# Patient Record
Sex: Female | Born: 2000 | Race: White | Hispanic: No | Marital: Single
Health system: Southern US, Community
[De-identification: ages and names within clinical notes are randomized; demographics above are authoritative.]

---
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
## 2022-01-20 ENCOUNTER — Emergency Department (HOSPITAL_COMMUNITY)
Admission: EM | Admit: 2022-01-20 | Discharge: 2022-01-20 | Disposition: A | Discharge status: Home / Self Care | Payer: 59 | Attending: Emergency Medicine | Admitting: Emergency Medicine

## 2022-01-20 ENCOUNTER — Other Ambulatory Visit: Payer: Self-pay

## 2022-01-20 DIAGNOSIS — K219 Gastro-esophageal reflux disease without esophagitis: Secondary | ICD-10-CM | POA: Insufficient documentation

## 2022-01-20 DIAGNOSIS — R059 Cough, unspecified: Secondary | ICD-10-CM | POA: Diagnosis present

## 2022-01-20 MED ORDER — PANTOPRAZOLE SODIUM 20 MG PO TBEC: 20.0000 mg | DELAYED_RELEASE_TABLET | Freq: Two times a day (BID) | ORAL | 0 refills | Status: DC

## 2022-01-20 MED ORDER — SUCRALFATE 1 G PO TABS
1.0000 g | ORAL_TABLET | Freq: Once | ORAL | Status: AC
  Administered 2022-01-20: 1 g via ORAL
  Filled 2022-01-20: qty 1

## 2022-01-20 MED ORDER — LIDOCAINE VISCOUS HCL 2 % MT SOLN
15.0000 mL | Freq: Once | OROMUCOSAL | Status: AC
  Administered 2022-01-20: 15 mL via ORAL
  Filled 2022-01-20: qty 15

## 2022-01-20 MED ORDER — ALUM & MAG HYDROXIDE-SIMETH 200-200-20 MG/5ML PO SUSP
30.0000 mL | Freq: Once | ORAL | Status: AC
  Administered 2022-01-20: 30 mL via ORAL
  Filled 2022-01-20: qty 30

## 2022-01-20 MED ORDER — SUCRALFATE 1 G PO TABS: 1.0000 g | ORAL_TABLET | Freq: Four times a day (QID) | ORAL | 0 refills | Status: DC

## 2022-01-20 NOTE — ED Triage Notes (Signed)
Patient reports she feels like something is stuck in throat/chest, coughing and "spitting up" for a couple of months. Patient says she feels burning in central chest region and back.

## 2022-01-20 NOTE — ED Provider Notes (Signed)
[place] DEPT Provider Note   CSN: [id number] Arrival date & time: 01/20/22  8786     History  Chief Complaint  Patient presents with   Cough    Rebecca Hoover is a 21 y.o. female.  21 year old female presents with several month history of difficulty swallowing.  Patient states that is exacerbated by certain foods.  Denies any emesis.  States that it can make her feel anxious at times with chest discomfort.  Denies any dyspnea.  No fever or chills.  No hemoptysis.      Home Medications Prior to Admission medications   Not on File      Allergies    Patient has no known allergies.    Review of Systems   Review of Systems  All other systems reviewed and are negative.  Physical Exam Updated Vital Signs BP 112/84    Pulse 68    Temp 97.6 F (36.4 C) (Oral)    Resp 13    Ht 1.702 m (5\' 7" )    Wt 56.2 kg    LMP 12/30/2021    SpO2 100%    BMI 19.42 kg/m  Physical Exam Vitals and nursing note reviewed.  Constitutional:      General: She is not in acute distress.    Appearance: Normal appearance. She is well-developed. She is not toxic-appearing.  HENT:     Head: Normocephalic and atraumatic.  Eyes:     General: Lids are normal.     Conjunctiva/sclera: Conjunctivae normal.     Pupils: Pupils are equal, round, and reactive to light.  Neck:     Thyroid: No thyroid mass.     Trachea: No tracheal deviation.  Cardiovascular:     Rate and Rhythm: Normal rate and regular rhythm.     Heart sounds: Normal heart sounds. No murmur heard.   No gallop.  Pulmonary:     Effort: Pulmonary effort is normal. No respiratory distress.     Breath sounds: Normal breath sounds. No stridor. No decreased breath sounds, wheezing, rhonchi or rales.  Abdominal:     General: There is no distension.     Palpations: Abdomen is soft.     Tenderness: There is no abdominal tenderness. There is no rebound.  Musculoskeletal:        General: No tenderness. Normal  range of motion.     Cervical back: Normal range of motion and neck supple.  Skin:    General: Skin is warm and dry.     Findings: No abrasion or rash.  Neurological:     Mental Status: She is alert and oriented to person, place, and time. Mental status is at baseline.     GCS: GCS eye subscore is 4. GCS verbal subscore is 5. GCS motor subscore is 6.     Cranial Nerves: No cranial nerve deficit.     Sensory: No sensory deficit.     Motor: Motor function is intact.  Psychiatric:        Attention and Perception: Attention normal.        Speech: Speech normal.        Behavior: Behavior normal.    ED Results / Procedures / Treatments   Labs (all labs ordered are listed, but only abnormal results are displayed) Labs Reviewed - No data to display  EKG None  Radiology No results found.  Procedures Procedures    Medications Ordered in ED Medications  alum & mag hydroxide-simeth (MAALOX/MYLANTA) 200-200-20 MG/5ML suspension  30 mL (has no administration in time range)    And  lidocaine (XYLOCAINE) 2 % viscous mouth solution 15 mL (has no administration in time range)  sucralfate (CARAFATE) tablet 1 g (has no administration in time range)    ED Course/ Medical Decision Making/ A&P                           Medical Decision Making Risk OTC drugs. Prescription drug management.   Old records reviewed and care done with mother in room.  Patient symptoms likely consistent with reflux.  Treated for same here.  Will place on PPI and Carafate.  Have instructed her to follow-up with a GI doctor for shortness        Final Clinical Impression(s) / ED Diagnoses Final diagnoses:  None    Rx / DC Orders ED Discharge Orders     None         Lacretia Leigh, MD 01/20/22 2008

## 2022-01-20 NOTE — ED Notes (Signed)
Pt care taken, obtained urine specimen, no complaints at this time.

## 2022-12-29 ENCOUNTER — Encounter (HOSPITAL_COMMUNITY): Payer: Self-pay | Admitting: Emergency Medicine

## 2022-12-29 ENCOUNTER — Ambulatory Visit (INDEPENDENT_AMBULATORY_CARE_PROVIDER_SITE_OTHER)
Admission: EM | Admit: 2022-12-29 | Discharge: 2022-12-31 | Disposition: A | Discharge status: Other Acute Inpatient Hospital | Payer: 59 | Source: Home / Self Care

## 2022-12-29 DIAGNOSIS — Z1152 Encounter for screening for COVID-19: Secondary | ICD-10-CM | POA: Insufficient documentation

## 2022-12-29 DIAGNOSIS — F23 Brief psychotic disorder: Secondary | ICD-10-CM

## 2022-12-29 DIAGNOSIS — F419 Anxiety disorder, unspecified: Secondary | ICD-10-CM | POA: Insufficient documentation

## 2022-12-29 DIAGNOSIS — R45851 Suicidal ideations: Secondary | ICD-10-CM | POA: Insufficient documentation

## 2022-12-29 DIAGNOSIS — Z3202 Encounter for pregnancy test, result negative: Secondary | ICD-10-CM

## 2022-12-29 DIAGNOSIS — F29 Unspecified psychosis not due to a substance or known physiological condition: Secondary | ICD-10-CM | POA: Diagnosis not present

## 2022-12-29 DIAGNOSIS — Z9152 Personal history of nonsuicidal self-harm: Secondary | ICD-10-CM | POA: Insufficient documentation

## 2022-12-29 LAB — URINALYSIS, ROUTINE W REFLEX MICROSCOPIC
Bilirubin Urine: NEGATIVE
Glucose, UA: NEGATIVE mg/dL
Hgb urine dipstick: NEGATIVE
Ketones, ur: 20 mg/dL — AB
Nitrite: NEGATIVE
Protein, ur: NEGATIVE mg/dL
Specific Gravity, Urine: 1.011 (ref 1.005–1.030)
pH: 6 (ref 5.0–8.0)

## 2022-12-29 LAB — POCT URINE DRUG SCREEN - MANUAL ENTRY (I-SCREEN)
POC Amphetamine UR: NOT DETECTED
POC Buprenorphine (BUP): NOT DETECTED
POC Cocaine UR: NOT DETECTED
POC Marijuana UR: NOT DETECTED
POC Methadone UR: NOT DETECTED
POC Methamphetamine UR: NOT DETECTED
POC Morphine: NOT DETECTED
POC Oxazepam (BZO): POSITIVE — AB
POC Oxycodone UR: NOT DETECTED
POC Secobarbital (BAR): NOT DETECTED

## 2022-12-29 LAB — COMPREHENSIVE METABOLIC PANEL
ALT: 18 U/L (ref 0–44)
AST: 22 U/L (ref 15–41)
Albumin: 4.8 g/dL (ref 3.5–5.0)
Alkaline Phosphatase: 85 U/L (ref 38–126)
Anion gap: 10 (ref 5–15)
BUN: 11 mg/dL (ref 6–20)
CO2: 23 mmol/L (ref 22–32)
Calcium: 9.8 mg/dL (ref 8.9–10.3)
Chloride: 105 mmol/L (ref 98–111)
Creatinine, Ser: 0.73 mg/dL (ref 0.44–1.00)
GFR, Estimated: >60 mL/min (ref >60)
Glucose, Bld: 83 mg/dL (ref 70–99)
Potassium: 3.8 mmol/L (ref 3.5–5.1)
Sodium: 138 mmol/L (ref 135–145)
Total Bilirubin: 0.9 mg/dL (ref 0.3–1.2)
Total Protein: 7.8 g/dL (ref 6.5–8.1)

## 2022-12-29 LAB — CBC WITH DIFFERENTIAL/PLATELET
Abs Immature Granulocytes: 0.02 10*3/uL (ref 0.00–0.07)
Basophils Absolute: 0.1 10*3/uL (ref 0.0–0.1)
Basophils Relative: 1 %
Eosinophils Absolute: 0 10*3/uL (ref 0.0–0.5)
Eosinophils Relative: 0 %
HCT: 39 % (ref 36.0–46.0)
Hemoglobin: 13.2 g/dL (ref 12.0–15.0)
Immature Granulocytes: 0 %
Lymphocytes Relative: 21 %
Lymphs Abs: 1.4 10*3/uL (ref 0.7–4.0)
MCH: 30.9 pg (ref 26.0–34.0)
MCHC: 33.8 g/dL (ref 30.0–36.0)
MCV: 91.3 fL (ref 80.0–100.0)
Monocytes Absolute: 0.5 10*3/uL (ref 0.1–1.0)
Monocytes Relative: 7 %
Neutro Abs: 4.9 10*3/uL (ref 1.7–7.7)
Neutrophils Relative %: 71 %
Platelets: 340 10*3/uL (ref 150–400)
RBC: 4.27 MIL/uL (ref 3.87–5.11)
RDW: 13.2 % (ref 11.5–15.5)
WBC: 6.9 10*3/uL (ref 4.0–10.5)
nRBC: 0 % (ref 0.0–0.2)

## 2022-12-29 LAB — LIPID PANEL
Cholesterol: 168 mg/dL (ref 0–200)
HDL: 71 mg/dL (ref >40)
LDL Cholesterol: 88 mg/dL (ref 0–99)
Total CHOL/HDL Ratio: 2.4 RATIO
Triglycerides: 45 mg/dL (ref <150)
VLDL: 9 mg/dL (ref 0–40)

## 2022-12-29 LAB — POCT PREGNANCY, URINE: Preg Test, Ur: NEGATIVE

## 2022-12-29 LAB — HEMOGLOBIN A1C
Hgb A1c MFr Bld: 4.7 % — ABNORMAL LOW (ref 4.8–5.6)
Mean Plasma Glucose: 88.19 mg/dL

## 2022-12-29 LAB — RESP PANEL BY RT-PCR (RSV, FLU A&B, COVID)  RVPGX2
Influenza A by PCR: NEGATIVE
Influenza B by PCR: NEGATIVE
Resp Syncytial Virus by PCR: NEGATIVE
SARS Coronavirus 2 by RT PCR: NEGATIVE

## 2022-12-29 LAB — POC SARS CORONAVIRUS 2 AG: SARSCOV2ONAVIRUS 2 AG: NEGATIVE

## 2022-12-29 LAB — MAGNESIUM: Magnesium: 1.8 mg/dL (ref 1.7–2.4)

## 2022-12-29 LAB — ETHANOL: Alcohol, Ethyl (B): <10 mg/dL (ref <10)

## 2022-12-29 LAB — TSH: TSH: 0.68 u[IU]/mL (ref 0.350–4.500)

## 2022-12-29 MED ORDER — OLANZAPINE 5 MG PO TBDP
5.0000 mg | ORAL_TABLET | Freq: Once | ORAL | Status: AC | PRN
  Administered 2022-12-29: 5 mg via ORAL
  Filled 2022-12-29: qty 1

## 2022-12-29 MED ORDER — ALUM & MAG HYDROXIDE-SIMETH 200-200-20 MG/5ML PO SUSP: 30.0000 mL | ORAL | Status: DC | PRN

## 2022-12-29 MED ORDER — ACETAMINOPHEN 325 MG PO TABS: 650.0000 mg | ORAL_TABLET | Freq: Four times a day (QID) | ORAL | Status: DC | PRN

## 2022-12-29 MED ORDER — MAGNESIUM HYDROXIDE 400 MG/5ML PO SUSP: 30.0000 mL | Freq: Every day | ORAL | Status: DC | PRN

## 2022-12-29 MED ORDER — HYDROXYZINE HCL 25 MG PO TABS
25.0000 mg | ORAL_TABLET | Freq: Three times a day (TID) | ORAL | Status: DC | PRN
  Administered 2022-12-30 – 2022-12-31 (×2): 25 mg via ORAL
  Filled 2022-12-29 (×2): qty 1

## 2022-12-29 NOTE — ED Notes (Signed)
Patient brings paper that she has drawn on or written names with phone numbers on. Staff has informed her a few times to keep her papers with her. Patient assigned to bed three but moving her food, etc to bed two. This nurse has informed her a few times to not go to another bed, that she must stay on her assigned bed. Linen removed from bed 2 and patient removed her personal items from the bed.  Patient removed her shirt in the observation area. Staff redirected and informed her that she could not remove her clothes at any time unless in the rest room. Patient began smiling and laughing. Patient put her short back on.   Dinner offered to patient and she held her finger up to staff. Safety maintained and will continue to monitor.

## 2022-12-29 NOTE — Progress Notes (Signed)
   12/29/22 1217  [place] (Walk-ins at [facility] only)  How Did You Hear About [place]? Family/Friend (Mother)  What Is the Reason for Your Visit/Call Today? Pt is a 22 yo female who presented voluntarily and accompnaied by her mother due to an emotional episode that occurred yesterday. Clinician spoke to pt and mother separately at pt's request. Clinician spoke to mother with pt's verbal permission. Pt denied current SI, HI, NSSH, AVH, paranoia or substance use. Pt described an episode she had yesterday as "an anxiety attack." Per pt and mom, pt sat in the floor, started holding her ears and screaming and crying. Per pt and mom, pt was triggered by worries about a friend of hers who is nPt stated sometimes she has suicidal thoughts in her dreams. ewly in the [place] and having difficulty being away from home. Pt calls him her boyfriend; mom says he is just s friend from high school. Mom described other similar episodes often at musical events (concerts, church) in which pt become upset in a similar manner. Pt had pressured speech, racing thoughts jumping from topic to topic and decreased ability to concentrated and stay focused. Pt has a 31 yo half sister who has been diagnosed with Bipolar d/o per mom.  How Long Has This Been Causing You Problems? > than 6 months  Have You Recently Had Any Thoughts About Hurting Yourself? Yes  How long ago did you have thoughts about hurting yourself? last night "in my sleep"  Are You Planning to [place] At This time? No  Have you Recently Had Thoughts About [place]? No  Are You Planning To Harm Someone At This Time? No  Are you currently experiencing any auditory, visual or other hallucinations? No  Have You Used Any Alcohol or Drugs in the Past 24 Hours? No  Do you have any current medical co-morbidities that require immediate attention? No  Clinician description of patient physical appearance/behavior: Pt was calm, cooperative,  cheerful, alert and seemed fully oriented although with deminished ability to concentrate and stay focused. Pt's judgment and insight was poor to fair. Pt's mood was euthymic and she displayed a range of expressions/emotions. Pt's speech seemed pressured and movement seemed normal.  What Do You Feel Would Help You the Most Today? Treatment for Depression or other mood problem  If access to [facility] was not available, would you have sought care in the Emergency Department? No  Determination of Need Routine (7 days)  Options For Referral Outpatient Therapy;Medication Management   Elwin Tsou T. Mare Ferrari, [place], [facility], [facility] Triage Specialist [facility]

## 2022-12-29 NOTE — ED Notes (Signed)
Patient sitting in the floor in the rest room with jeans and underwear down. Stated to the staff, "This comforts me". Staff requested that she pull her jeans and underwear back up and leave the rest room.

## 2022-12-29 NOTE — ED Provider Notes (Signed)
[facility] Continuous Assessment Admission H&P  Date: 12/29/22 Patient Name: Rebecca Hoover MRN: [id number] Chief Complaint: "I got a hack on my phone.. and I got anxious"     Diagnoses:  Final diagnoses:  Suicidal ideation  "I got a hack on my phone when I was talking to my boyfriend who lives [place]..."  HPI: Information obtained for both patient and her mother. Pt is a 22 yo female who presented voluntarily and accompanied by her mother due to an emotional episode that occurred yesterday. Patient was evaluated separately from her mother. As soon as her mother left the room, patient started crying, reporting that "people yell at me at home.". Patient reported that her mother yells at her when she gets angry.  Patient reported she got hacked while she was talking to her boyfriend who lives out of the country "I got back on my phone and got very anxious".  She reported hx of cutting and stated, "I don't know why I do it". She reports hx of attempts x9 which her mother did not know about. Patient appeared to be slow processing when telling her story.  She admitted to having suicidal thoughts, planning to use a knife when she gets home. Also, Pt stated sometimes she has suicidal thoughts in her dreams. Pt described an episode she had yesterday as "an anxiety attack." Per pt and mom, pt sat in the floor, started holding her ears and screaming and crying. Per pt and mom, pt was triggered by worries about a friend of hers who is newly in the [employer] and having difficulty being away from home. Pt calls him her boyfriend; mom says he is just s friend from high school. Mom described other similar episodes often at musical events (concerts, church) in which pt become upset in a similar manner. Pt had pressured speech, racing thoughts jumping from topic to topic and decreased ability to concentrated and stay focused. Pt has a 29 yo half-sister who has been diagnosed with Bipolar d/o per mom.   Upon  assessment: Vital signs reviewed.  Patient was receptive upon approach but had poor eye contact. She was tearful, reporting that her mother gets angry with her at home. She was well groomed and appeared to be well-nourished. Patient was alert and oriented but experiencing difficulties articulating her thoughts and concerns. She appeared to be physically healthy.  She had poor eye contact. Her storytelling sounded somewhat bizarre. For example, patient reported that she has suicidal thoughts when asleep, she has attempted suicide 9 times but unable to clarify when and what method. She denied substance use problem. Patient was able to report that her biological mother passed away last year. She has hx of cutting but does not know why she was doing it. Patient reported that she does not feel safe at home.   Per patient's mother, patient has a history of learning, speech disability (Apraxia) and was bullied in school for that. She was once tested for Autism Spectrum, but nothing was conformed. "I know she was always different even when she was a younger kid, not sure why they didn't diagnose her". Patient sees a provider at [facility] and has been prescribed Lexapro which does not seem to help with her anxiety. Patient has been experiencing episodes of severe anxiety for about 3 days.   I discussed with patient about her admission to the observation unit for additional evaluations to determine disposition. Patient was willing to talk more about her feeling unsafe at home and  her thoughts of self-harm. She will be reevaluated in AM.     PHQ 2-9:   Flowsheet Row ED from 01/20/2022 in [place] DEPT  C-SSRS RISK CATEGORY No Risk        Total Time spent with patient: 45 minutes  Musculoskeletal  Strength & Muscle Tone: within normal limits Gait & Station: normal Patient leans: N/A  Psychiatric Specialty Exam  Presentation General Appearance:  Appropriate for  Environment  Eye Contact: Minimal  Speech: Pressured  Speech Volume: Decreased  Handedness: Right   Mood and Affect  Mood: Anxious  Affect: Depressed   Thought Process  Thought Processes: Irrevelant  Descriptions of Associations:Loose  Orientation:Full (Time, Place and Person)  Thought Content:Logical    Hallucinations:Hallucinations: None  Ideas of Reference:None  Suicidal Thoughts:Suicidal Thoughts: Yes, Active SI Active Intent and/or Plan: With Intent SI Passive Intent and/or Plan: With Intent  Homicidal Thoughts:Homicidal Thoughts: No   Sensorium  Memory: Immediate Fair; Recent Fair; Remote Poor  Judgment: Poor  Insight: Fair   Materials engineer: Fair  Attention Span: Fair  Recall: [place] of Knowledge: Fair  Language: Fair   Psychomotor Activity  Psychomotor Activity: Psychomotor Activity: Restlessness   Assets  Assets: Desire for Improvement; Housing; Physical Health   Sleep  Sleep: Sleep: Poor Number of Hours of Sleep: 3   Nutritional Assessment (For OBS and FBC admissions only) Has the patient had a weight loss or gain of 10 pounds or more in the last 3 months?: No Has the patient had a decrease in food intake/or appetite?: Yes Does the patient have dental problems?: No Does the patient have eating habits or behaviors that may be indicators of an eating disorder including binging or inducing vomiting?: No Has the patient recently lost weight without trying?: 0 Has the patient been eating poorly because of a decreased appetite?: 1 Malnutrition Screening Tool Score: 1    Physical Exam Vitals reviewed.  HENT:     Head: Normocephalic.     Right Ear: Tympanic membrane normal.     Left Ear: Tympanic membrane normal.     Nose: Nose normal.  Eyes:     Extraocular Movements: Extraocular movements intact.     Pupils: Pupils are equal, round, and reactive to light.  Cardiovascular:     Rate  and Rhythm: Normal rate and regular rhythm.  Pulmonary:     Effort: Pulmonary effort is normal.     Breath sounds: Normal breath sounds.  Musculoskeletal:     Cervical back: Normal range of motion.  Skin:    General: Skin is warm.  Neurological:     Mental Status: She is alert and oriented to person, place, and time.     Comments: Learning and speech disability    Review of Systems  Constitutional: Negative.   HENT: Negative.    Eyes: Negative.   Respiratory: Negative.    Cardiovascular: Negative.   Gastrointestinal: Negative.   Genitourinary: Negative.   Musculoskeletal: Negative.   Skin: Negative.   Neurological: Negative.   Endo/Heme/Allergies: Negative.   Psychiatric/Behavioral:  Positive for depression. The patient is nervous/anxious.     Blood pressure 122/88, pulse 100, temperature 98.3 F (36.8 C), temperature source Oral, resp. rate 17, SpO2 100 %. There is no height or weight on file to calculate BMI.  Past Psychiatric History: Unknown   Is the patient at risk to self? Yes  Has the patient been a risk to self in the past 6 months?  Yes .    Has the patient been a risk to self within the distant past? Yes   Is the patient a risk to others? No   Has the patient been a risk to others in the past 6 months? No   Has the patient been a risk to others within the distant past? No   Past Medical History: No past medical history on file.  Family History: No family history on file.  Social History:  Social History   Socioeconomic History   Marital status: Widowed    Spouse name: Not on file   Number of children: Not on file   Years of education: Not on file   Highest education level: Not on file  Occupational History   Not on file  Tobacco Use   Smoking status: Not on file   Smokeless tobacco: Not on file  Substance and Sexual Activity   Alcohol use: Not on file   Drug use: Not on file   Sexual activity: Not on file  Other Topics Concern   Not on file   Social History Narrative   ** Merged History Encounter **       Social Determinants of Health   Financial Resource Strain: Not on file  Food Insecurity: Not on file  Transportation Needs: Not on file  Physical Activity: Not on file  Stress: Not on file  Social Connections: Not on file  Intimate Partner Violence: Not on file    SDOH:    Last Labs:  No visits with results within 6 Month(s) from this visit.  Latest known visit with results is:  No results found for any previous visit.    Allergies: Patient has no known allergies.  PTA Medications: (Not in a hospital admission)   Medical Decision Making  Admit to Obs unit for overnight safety monitoring.  To be reevaluated in AM Labs ordered: CBC, CMP, Covid19 POC/PCR, UDS, A1C, TSH, UA, UDS, UPT, magnesium, Lipid Panel  Medications ordered: Tylenol, 650 mg PO Q6 PRN for mild pain Milk of Magnesia 30 ml PO QD PRN for mild constipation Maoox 30 ml PO Q 4 PRN for indigestion Vistaril 25 mg PO TID for anxiety    Recommendations  Based on my evaluation the patient does not appear to have an emergency medical condition.  Admit to Observation unit and initiate safety protocol.   Ronelle Nigh, NP 12/29/22  1:49 PM

## 2022-12-29 NOTE — ED Notes (Signed)
Patient was admitted to the obs unit. Patient is calm and cooperative. Patient denies SI/HI and AVH. Patient is being monitored for safety.

## 2022-12-29 NOTE — ED Notes (Signed)
Pt awake, alert & responsive, no distress noted.  Pt intrusive, rambling through trash and linen basket.  Difficult to redirect.  Pt sitting on floor at present.  NP Ene [place] notified.

## 2022-12-29 NOTE — ED Notes (Signed)
Pt awake, alert & resting at present, no distress noted.  Monitoring for safety.
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
## 2022-12-30 MED ORDER — LORAZEPAM 0.5 MG PO TABS
0.5000 mg | ORAL_TABLET | Freq: Once | ORAL | Status: AC
  Administered 2022-12-30: 0.5 mg via ORAL
  Filled 2022-12-30: qty 1

## 2022-12-30 MED ORDER — OLANZAPINE 5 MG PO TBDP
5.0000 mg | ORAL_TABLET | Freq: Every day | ORAL | Status: DC
  Administered 2022-12-30: 5 mg via ORAL
  Filled 2022-12-30: qty 1

## 2022-12-30 MED ORDER — OLANZAPINE 5 MG PO TBDP
5.0000 mg | ORAL_TABLET | Freq: Once | ORAL | Status: AC
  Administered 2022-12-30: 5 mg via ORAL
  Filled 2022-12-30: qty 1

## 2022-12-30 NOTE — ED Notes (Signed)
Patient observed/assessed in bed/chair sitting up conversing with other patients. Patient alert and oriented x 4. Affect is bright and eye contact is appropriate.  Patient denies pain and anxiety. He denies A/V/H. He denies having any thoughts/plan of self harm and harm towards others. Fluid and snack offered. Patient states that appetite has been good throughout the day. Last BM was today 12/30/21.  Verbalizes no further complaints at this time. Will continue to monitor and support.

## 2022-12-30 NOTE — ED Notes (Signed)
Patient  resting in no acute stress..Environment secured .Will continue to monitor for safely.

## 2022-12-30 NOTE — ED Notes (Signed)
Patient is watching tv . Alert and oriented. Will continue to monitor for safety.

## 2022-12-30 NOTE — ED Notes (Signed)
Pt remains awake & restless, mumbling & laughing inappropriately.  Monitoring for safety.

## 2022-12-30 NOTE — ED Notes (Signed)
Patient is lying in bed quietly, no distress noted will continue to monitor patient for safety

## 2022-12-30 NOTE — ED Notes (Signed)
Patient  sleeping in no acute stress. RR even and unlabored .Environment secured .Will continue to monitor for safely.
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
## 2022-12-30 NOTE — ED Notes (Signed)
Patient observed/assessed in bed/chair resting quietly appearing in no distress and verbalizing no complaints at this time. Will continue to monitor.

## 2022-12-30 NOTE — ED Notes (Addendum)
Patient alert and oriented .Denies SI/HI/VH.  Reports AH when she is sleep. Patient can answer some question coherently others she is very disorganized when answering.Denies intent or plan to harm self or others. Routine conducted according to faculty protocol. Encourage patient to notify staff with any needs or concerns. Patient verbalized agreement and understanding. Will continue to monitor for safety.

## 2022-12-30 NOTE — ED Notes (Signed)
Patient gave Rn permission to tell her mother that she is doing ok.

## 2022-12-31 ENCOUNTER — Emergency Department (HOSPITAL_COMMUNITY)
Admission: EM | Admit: 2022-12-31 | Discharge: 2023-01-01 | Disposition: A | Discharge status: Other Acute Inpatient Hospital | Payer: 59 | Source: Home / Self Care

## 2022-12-31 ENCOUNTER — Other Ambulatory Visit: Payer: Self-pay

## 2022-12-31 ENCOUNTER — Ambulatory Visit (HOSPITAL_COMMUNITY): Payer: 59

## 2022-12-31 ENCOUNTER — Encounter (HOSPITAL_COMMUNITY): Payer: Self-pay | Admitting: Emergency Medicine

## 2022-12-31 ENCOUNTER — Emergency Department (HOSPITAL_COMMUNITY): Payer: 59

## 2022-12-31 DIAGNOSIS — F23 Brief psychotic disorder: Secondary | ICD-10-CM | POA: Diagnosis not present

## 2022-12-31 DIAGNOSIS — Z1152 Encounter for screening for COVID-19: Secondary | ICD-10-CM | POA: Insufficient documentation

## 2022-12-31 DIAGNOSIS — R45851 Suicidal ideations: Secondary | ICD-10-CM | POA: Diagnosis not present

## 2022-12-31 DIAGNOSIS — F29 Unspecified psychosis not due to a substance or known physiological condition: Secondary | ICD-10-CM

## 2022-12-31 DIAGNOSIS — Z008 Encounter for other general examination: Secondary | ICD-10-CM

## 2022-12-31 DIAGNOSIS — Z0279 Encounter for issue of other medical certificate: Secondary | ICD-10-CM | POA: Insufficient documentation

## 2022-12-31 LAB — RESP PANEL BY RT-PCR (RSV, FLU A&B, COVID)  RVPGX2
Influenza A by PCR: NEGATIVE
Influenza B by PCR: NEGATIVE
Resp Syncytial Virus by PCR: NEGATIVE
SARS Coronavirus 2 by RT PCR: NEGATIVE

## 2022-12-31 MED ORDER — OLANZAPINE 5 MG PO TBDP: 5.0000 mg | ORAL_TABLET | Freq: Once | ORAL | Status: DC

## 2022-12-31 MED ORDER — NORETHINDRONE ACET-ETHINYL EST 1-20 MG-MCG PO TABS: 1.0000 | ORAL_TABLET | Freq: Every day | ORAL | Status: DC

## 2022-12-31 MED ORDER — OLANZAPINE 5 MG PO TBDP: 5.0000 mg | ORAL_TABLET | Freq: Two times a day (BID) | ORAL | Status: DC

## 2022-12-31 MED ORDER — LORAZEPAM 1 MG PO TABS: 1.0000 mg | ORAL_TABLET | ORAL | Status: DC | PRN

## 2022-12-31 MED ORDER — ZIPRASIDONE MESYLATE 20 MG IM SOLR: 20.0000 mg | INTRAMUSCULAR | Status: DC | PRN

## 2022-12-31 MED ORDER — OLANZAPINE 5 MG PO TBDP
5.0000 mg | ORAL_TABLET | Freq: Three times a day (TID) | ORAL | Status: DC | PRN
  Administered 2022-12-31: 5 mg via ORAL
  Filled 2022-12-31: qty 1

## 2022-12-31 NOTE — ED Notes (Signed)
Patient lying in bed awake and staring blankly. Patient has been consistently waking and speaking incoherently throughout the night. Supportive listening and diversion techniques have been attempted with minimal improvements. Will continue to monitor/support.
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
## 2022-12-31 NOTE — ED Provider Notes (Signed)
[place] EMERGENCY DEPARTMENT Provider Note   CSN: [id number] Arrival date & time: 12/31/22  1807     History  Chief Complaint  Patient presents with   Medical Clearance    Rebecca Hoover is a 22 y.o. female presents under active IVC order for acute psychosis, she is being seen and evaluated at the [facility] behavioral health urgent care, she is under IVC and in police custody.  She has appropriate but confabulatory speech, no dysarthria, no focal neurologic deficits, she does not complain of any medical issues at this time.  No previous medical history.  She has stable vital signs in the emergency department, the request was for CT head, EKG, and negative COVID swab prior to discharge back to behavioral health.  HPI     Home Medications Prior to Admission medications   Medication Sig Start Date End Date Taking? Authorizing Provider  norethindrone-ethinyl estradiol (LOESTRIN) 1-20 MG-MCG tablet Take 1 tablet by mouth daily. Patient not taking: Reported on 12/30/2022 12/11/22   [provider]  OLANZapine zydis (ZYPREXA) 5 MG disintegrating tablet Take 1 tablet (5 mg total) by mouth 2 (two) times daily. 12/31/22   Merrily Brittle, DO      Allergies    Patient has no known allergies.    Review of Systems   Review of Systems  Psychiatric/Behavioral:  Positive for confusion and hallucinations.   All other systems reviewed and are negative.   Physical Exam Updated Vital Signs BP 113/88 (BP Location: Right Arm)   Pulse 99   Temp 97.8 F (36.6 C) (Oral)   Resp 18   SpO2 99%  Physical Exam Vitals and nursing note reviewed.  Constitutional:      General: She is not in acute distress.    Appearance: Normal appearance.  HENT:     Head: Normocephalic and atraumatic.  Eyes:     General:        Right eye: No discharge.        Left eye: No discharge.  Cardiovascular:     Rate and Rhythm: Normal rate and regular rhythm.  Pulmonary:      Effort: Pulmonary effort is normal. No respiratory distress.  Musculoskeletal:        General: No deformity.  Skin:    General: Skin is warm and dry.  Neurological:     Mental Status: She is alert.     Comments: Moves all 4 limbs spontaneously, CN II through XII grossly intact, can ambulate without difficulty, intact sensation throughout.  Not alert and oriented to self, time, location, or situation  Psychiatric:     Comments: Patient with nonsensical speech, confabulatory, she is forming words appropriately but none of her responses makes sense, left uninterrupted in room she has been talking to herself for a fair amount of time     ED Results / Procedures / Treatments   Labs (all labs ordered are listed, but only abnormal results are displayed) Labs Reviewed - No data to display  EKG None  Radiology CT Head Wo Contrast  Result Date: 12/31/2022 CLINICAL DATA:  Mental status change, unknown cause EXAM: CT HEAD WITHOUT CONTRAST TECHNIQUE: Contiguous axial images were obtained from the base of the skull through the vertex without intravenous contrast. RADIATION DOSE REDUCTION: This exam was performed according to the departmental dose-optimization program which includes automated exposure control, adjustment of the mA and/or kV according to patient size and/or use of iterative reconstruction technique. COMPARISON:  None Available. FINDINGS: Brain:  No acute intracranial abnormality. Specifically, no hemorrhage, hydrocephalus, mass lesion, acute infarction, or significant intracranial injury. Vascular: No hyperdense vessel or unexpected calcification. Skull: No acute calvarial abnormality. Sinuses/Orbits: No acute findings Other: None IMPRESSION: Normal study. Electronically Signed   By: Rolm Baptise M.D.   On: 12/31/2022 20:23    Procedures Procedures    Medications Ordered in ED Medications - No data to display  ED Course/ Medical Decision Making/ A&P                              Medical Decision Making Amount and/or Complexity of Data Reviewed Radiology: ordered.   This patient is a 22 y.o. female who presents to the ED for concern of Need for medical clearance from behavioral health urgent care after IVC for psychosis.  On my exam patient does have signs of psychosis, but no evidence of focal neurologic deficits, infectious symptoms or abnormal vitals other than mild occasional tachycardia.   Differential diagnoses prior to evaluation: Psychosis for purely psychiatric region versus neurologic abnormality contributing to psychosis, drug use, I have very low to almost no concern for active COVID, flu, or other respiratory infection in this patient given she is afebrile without any upper respiratory symptoms.  Physical Exam: Physical exam performed. The pertinent findings include: Patient showing signs of active psychosis, confabulatory speech but with no vital sign abnormalities, or acute neurologic deficits on my exam  I independently interpreted imaging including CT head without contrast which shows no acute intra cranial abnormality. I agree with the radiologist interpretation.  Independently interpreted an EKG which shows sinus tachycardia with interventricular conduction delay, no evidence of acute ischemia, or QT prolongation.   Disposition: After consideration of the diagnostic results and the patients response to treatment, I feel that patient is stable from medical perspective for discharge back to behavioral health urgent care, they had requested her to be cleared for COVID, however I can see in her chart that she had a negative COVID swab just 2 days ago, and without any upper respiratory infection symptoms, as well as a Producer, television/film/video of COVID swabs at this time I do not think that it is reasonable to reswab her for COVID tonight.   emergency department workup does not suggest an emergent condition requiring admission or immediate intervention beyond what  has been performed at this time. The plan is: as above, discharge safely back to [facility]. The patient is safe for discharge and has been instructed to return immediately for worsening symptoms, change in symptoms or any other concerns.  Final Clinical Impression(s) / ED Diagnoses Final diagnoses:  Medical clearance for psychiatric admission  Psychosis, unspecified psychosis type [facility])    Rx / DC Orders ED Discharge Orders     None         Dorien Chihuahua 12/31/22 2135    Tretha Sciara, MD 12/31/22 2218

## 2022-12-31 NOTE — ED Notes (Signed)
Patient observed walking on the unit pacing, and talking loudly as if she were on a phone call while she does not have a phone. Distraction techniques attempted and guest was encouraged to lie back in bad by staff. Patient continued to have conversation/responses with internal stimuli. Will continue to monitor.

## 2022-12-31 NOTE — ED Triage Notes (Signed)
Pt arrives from [facility] via [facility] for medical clearance. Per [facility], pt needs head ct scan. Pt arrives in dressed in scrubs and with IVC paperwork.

## 2022-12-31 NOTE — ED Notes (Signed)
Pt resting quietly, breathing is even and unlabored.  Pt denies SI, HI, pain visual hallucinations. Pt did state she was hearing things but was unable to describe them.  Pt then started talking about her grandmother stating she was talking to her last night.  Pt also stated she is upset with a female she knows and has decided not to speak with him anymore.  Pt jumps from subject to subject when speaking with this Probation officer. Will continue to monitor for safety.

## 2022-12-31 NOTE — ED Notes (Signed)
Report called to [facility].  [employer] called to serve and transport pt to ED for head CT.  Pt is to return to facility after CT.

## 2022-12-31 NOTE — ED Provider Triage Note (Signed)
Emergency Medicine Provider Triage Evaluation Note  Rebecca Hoover , a 22 y.o. female  was evaluated in triage.  Pt complains of patient presents from [facility] under IVC for psychosis, nonsensical speech, altered mental status, she is supposed to be medically cleared and then sent back to behavioral health, she needs a CT, EKG, and COVID swab per their facility.  Review of Systems  Positive: Confusion, psychosis Negative:   Physical Exam  BP 113/88 (BP Location: Right Arm)   Pulse 99   Temp 97.8 F (36.6 C) (Oral)   Resp 18   SpO2 99%  Gen:   Awake, no distress   Resp:  Normal effort  MSK:   Moves extremities without difficulty  Other:  Nonsensical speech but pleasant  Medical Decision Making  Medically screening exam initiated at 7:40 PM.  Appropriate orders placed.  Rebecca Hoover was informed that the remainder of the evaluation will be completed by another provider, this initial triage assessment does not replace that evaluation, and the importance of remaining in the ED until their evaluation is complete.  Workup initiated in triage  We will not perform a covid swab due to national shortage and backlog in our department and patient without any upper respiratory symptoms   Anselmo Pickler, PA-C 12/31/22 1940

## 2022-12-31 NOTE — Progress Notes (Signed)
Pt was accepted to [facility] 12/31/2022, pending CT, EKG, negative covid, and IVC paperwork faxed to [phone]. Bed assignment: 116-4  Pt meets inpatient criteria per Merrily Brittle, DO  Attending Physician will be Janine Limbo, MD  Report can be called to: - Child and Adolescence unit: [phone] -Adult unit: [phone]  Pt can arrive after pending items are received  Care Team Notified: Merrily Brittle, DO, Lynnda Shields, RN, Scharlene Gloss, RN, and Roanna Banning, LPN  [place], [place]  12/31/2022 3:02 PM

## 2022-12-31 NOTE — ED Notes (Signed)
Pts mother called with concern about pt's UA.  Reports concern of possible UTI.  She also wanted to voice her concerns regarding her daughter potentially being autistic.  No diagnosis at this time.

## 2022-12-31 NOTE — Discharge Instructions (Addendum)
Transferred to [place] ed for head ct

## 2022-12-31 NOTE — ED Notes (Signed)
Pt giggling to self.  No others interacting with pt at this time.  Pt also stated "my transmitter is on" pointing to her head.  States that is how she contacts her sister.

## 2022-12-31 NOTE — Discharge Instructions (Signed)
Patient was seen and evaluated in the emergency department, the request was for medical clearance with CT head, EKG, as well as COVID swab.  Patient with negative COVID swab from 2 days ago, no upper respiratory infectious symptoms, we do not have the resources to perform a repeat COVID swab on this patient, CT head unremarkable, EKG with sinus tachycardia with no abnormality otherwise, she is medically cleared from an emergency department perspective at this time for return to behavioral health under IVC.

## 2023-01-01 ENCOUNTER — Inpatient Hospital Stay (HOSPITAL_COMMUNITY)
Admission: AD | Admit: 2023-01-01 | Discharge: 2023-01-18 | DRG: 885 | Disposition: A | Discharge status: Home / Self Care | Payer: 59 | Source: Intra-hospital | Attending: Psychiatry | Admitting: Psychiatry

## 2023-01-01 DIAGNOSIS — F23 Brief psychotic disorder: Secondary | ICD-10-CM | POA: Diagnosis not present

## 2023-01-01 DIAGNOSIS — F339 Major depressive disorder, recurrent, unspecified: Secondary | ICD-10-CM | POA: Insufficient documentation

## 2023-01-01 DIAGNOSIS — R45851 Suicidal ideations: Secondary | ICD-10-CM | POA: Diagnosis present

## 2023-01-01 DIAGNOSIS — R482 Apraxia: Secondary | ICD-10-CM | POA: Diagnosis present

## 2023-01-01 DIAGNOSIS — F29 Unspecified psychosis not due to a substance or known physiological condition: Secondary | ICD-10-CM | POA: Insufficient documentation

## 2023-01-01 DIAGNOSIS — Z560 Unemployment, unspecified: Secondary | ICD-10-CM

## 2023-01-01 DIAGNOSIS — Z818 Family history of other mental and behavioral disorders: Secondary | ICD-10-CM

## 2023-01-01 DIAGNOSIS — Z1152 Encounter for screening for COVID-19: Secondary | ICD-10-CM | POA: Diagnosis not present

## 2023-01-01 DIAGNOSIS — F061 Catatonic disorder due to known physiological condition: Secondary | ICD-10-CM | POA: Diagnosis present

## 2023-01-01 DIAGNOSIS — R682 Dry mouth, unspecified: Secondary | ICD-10-CM | POA: Diagnosis present

## 2023-01-01 DIAGNOSIS — R479 Unspecified speech disturbances: Secondary | ICD-10-CM | POA: Insufficient documentation

## 2023-01-01 DIAGNOSIS — Z79899 Other long term (current) drug therapy: Secondary | ICD-10-CM

## 2023-01-01 DIAGNOSIS — F411 Generalized anxiety disorder: Secondary | ICD-10-CM | POA: Insufficient documentation

## 2023-01-01 MED ORDER — DIPHENHYDRAMINE HCL 50 MG/ML IJ SOLN: 50.0000 mg | Freq: Three times a day (TID) | INTRAMUSCULAR | Status: DC | PRN

## 2023-01-01 MED ORDER — ESCITALOPRAM OXALATE 5 MG PO TABS
5.0000 mg | ORAL_TABLET | Freq: Every day | ORAL | Status: AC
  Administered 2023-01-02: 5 mg via ORAL
  Filled 2023-01-01: qty 1

## 2023-01-01 MED ORDER — HALOPERIDOL 5 MG PO TABS
5.0000 mg | ORAL_TABLET | Freq: Three times a day (TID) | ORAL | Status: DC | PRN
  Administered 2023-01-04 – 2023-01-17 (×2): 5 mg via ORAL
  Filled 2023-01-01 (×2): qty 1

## 2023-01-01 MED ORDER — HYDROXYZINE HCL 25 MG PO TABS
25.0000 mg | ORAL_TABLET | Freq: Three times a day (TID) | ORAL | Status: DC | PRN
  Administered 2023-01-01 – 2023-01-02 (×4): 25 mg via ORAL
  Filled 2023-01-01 (×5): qty 1

## 2023-01-01 MED ORDER — LORAZEPAM 2 MG/ML IJ SOLN: 2.0000 mg | Freq: Three times a day (TID) | INTRAMUSCULAR | Status: DC | PRN

## 2023-01-01 MED ORDER — TRAZODONE HCL 50 MG PO TABS
50.0000 mg | ORAL_TABLET | Freq: Every evening | ORAL | Status: DC | PRN
  Administered 2023-01-01 – 2023-01-02 (×2): 50 mg via ORAL
  Filled 2023-01-01 (×3): qty 1

## 2023-01-01 MED ORDER — OLANZAPINE 5 MG PO TBDP: 5.0000 mg | ORAL_TABLET | Freq: Three times a day (TID) | ORAL | Status: DC | PRN

## 2023-01-01 MED ORDER — HALOPERIDOL LACTATE 5 MG/ML IJ SOLN: 5.0000 mg | Freq: Three times a day (TID) | INTRAMUSCULAR | Status: DC | PRN

## 2023-01-01 MED ORDER — ALUM & MAG HYDROXIDE-SIMETH 200-200-20 MG/5ML PO SUSP
30.0000 mL | ORAL | Status: DC | PRN
  Administered 2023-01-15: 30 mL via ORAL
  Filled 2023-01-01: qty 30

## 2023-01-01 MED ORDER — ZIPRASIDONE MESYLATE 20 MG IM SOLR: 20.0000 mg | INTRAMUSCULAR | Status: DC | PRN

## 2023-01-01 MED ORDER — NORETHINDRONE ACET-ETHINYL EST 1-20 MG-MCG PO TABS: 1.0000 | ORAL_TABLET | Freq: Every day | ORAL | Status: DC

## 2023-01-01 MED ORDER — ACETAMINOPHEN 325 MG PO TABS
650.0000 mg | ORAL_TABLET | Freq: Four times a day (QID) | ORAL | Status: DC | PRN
  Administered 2023-01-05 – 2023-01-15 (×5): 650 mg via ORAL
  Filled 2023-01-01 (×5): qty 2

## 2023-01-01 MED ORDER — LORAZEPAM 1 MG PO TABS
2.0000 mg | ORAL_TABLET | Freq: Three times a day (TID) | ORAL | Status: DC | PRN
  Administered 2023-01-17: 2 mg via ORAL
  Filled 2023-01-01: qty 2

## 2023-01-01 MED ORDER — LORAZEPAM 1 MG PO TABS: 1.0000 mg | ORAL_TABLET | ORAL | Status: DC | PRN

## 2023-01-01 MED ORDER — RISPERIDONE 1 MG PO TABS
1.0000 mg | ORAL_TABLET | Freq: Two times a day (BID) | ORAL | Status: DC
  Administered 2023-01-01 – 2023-01-02 (×3): 1 mg via ORAL
  Filled 2023-01-01 (×6): qty 1

## 2023-01-01 MED ORDER — MAGNESIUM HYDROXIDE 400 MG/5ML PO SUSP
30.0000 mL | Freq: Every day | ORAL | Status: DC | PRN
  Administered 2023-01-05 – 2023-01-06 (×2): 30 mL via ORAL
  Filled 2023-01-01 (×2): qty 30

## 2023-01-01 MED ORDER — OLANZAPINE 5 MG PO TBDP
5.0000 mg | ORAL_TABLET | Freq: Two times a day (BID) | ORAL | Status: DC
  Administered 2023-01-01: 5 mg via ORAL
  Filled 2023-01-01 (×3): qty 1

## 2023-01-01 MED ORDER — LORAZEPAM 1 MG PO TABS
1.0000 mg | ORAL_TABLET | Freq: Four times a day (QID) | ORAL | Status: DC | PRN
  Administered 2023-01-02: 1 mg via ORAL
  Filled 2023-01-01: qty 1

## 2023-01-01 MED ORDER — LORAZEPAM 0.5 MG PO TABS
0.5000 mg | ORAL_TABLET | Freq: Two times a day (BID) | ORAL | Status: DC
  Administered 2023-01-01 – 2023-01-02 (×3): 0.5 mg via ORAL
  Filled 2023-01-01 (×3): qty 1

## 2023-01-01 MED ORDER — ESCITALOPRAM OXALATE 10 MG PO TABS
10.0000 mg | ORAL_TABLET | Freq: Every day | ORAL | Status: AC
  Administered 2023-01-01: 10 mg via ORAL
  Filled 2023-01-01 (×2): qty 1

## 2023-01-01 MED ORDER — DIPHENHYDRAMINE HCL 25 MG PO CAPS
50.0000 mg | ORAL_CAPSULE | Freq: Three times a day (TID) | ORAL | Status: DC | PRN
  Administered 2023-01-04 – 2023-01-17 (×2): 50 mg via ORAL
  Filled 2023-01-01 (×2): qty 2

## 2023-01-01 NOTE — BHH Suicide Risk Assessment (Signed)
[name] INPATIENT:  Family/Significant Other Suicide Prevention Education  Suicide Prevention Education:  Education Completed;  [name] (mother) [phone],  (name of family member/significant other) has been identified by the patient as the family member/significant other with whom the patient will be residing, and identified as the person(s) who will aid the patient in the event of a mental health crisis (suicidal ideations/suicide attempt).  With written consent from the patient, the family member/significant other has been provided the following suicide prevention education, prior to the and/or following the discharge of the patient.  CSW spoke with mom who states that patient had some bouts of depression after her father died about a year ago which they put her on the generic form of lexapro but other than that no psychiatric history.  She reports that changes in schedules often times increase her anxiety and suspects that she may have some autism however has never been formally been diagnosed. She reports most current episode started within the past week when she went back to school for the semester.  Mother states that guns/weapons have been removed from the house and no additional safety concerns.   The suicide prevention education provided includes the following: Suicide risk factors Suicide prevention and interventions National Suicide Hotline telephone number [facility] assessment telephone number [facility] Emergency Assistance [place] and/or Residential Mobile Crisis Unit telephone number  Request made of family/significant other to: Remove weapons (e.g., guns, rifles, knives), all items previously/currently identified as safety concern.   Remove drugs/medications (over-the-counter, prescriptions, illicit drugs), all items previously/currently identified as a safety concern.  The family member/significant other verbalizes understanding of the suicide  prevention education information provided.  The family member/significant other agrees to remove the items of safety concern listed above.  Rebecca Hoover [name] 01/01/2023, 1:51 PM

## 2023-01-01 NOTE — Progress Notes (Signed)
Patient ID: Rebecca Hoover, female   DOB: 2001-01-06, 22 y.o.   MRN: [id number] Admission note: pt is observed giggling to self and laughs inappropriate during conversation. Pt speech appears disorganized and jumps from one topic to another. Pt struggles to stay focused on a topic but is redirectable. Pt was unable to sign consent forms. Meal and drink offered and was accepted. Support and encouragement provided as needed.   Patient remains safe on the unit.

## 2023-01-01 NOTE — BHH Group Notes (Signed)
Pt attended group 

## 2023-01-01 NOTE — ED Notes (Signed)
[employer] called for transport

## 2023-01-01 NOTE — Progress Notes (Signed)
Adult Psychoeducational Group Note  Date:  01/01/2023 Time:  10:37 AM  Group Topic/Focus:  Goals Group:   The focus of this group is to help patients establish daily goals to achieve during treatment and discuss how the patient can incorporate goal setting into their daily lives to aide in recovery.  Participation Level:  Did Not Attend  Participation Quality:   n/a  Affect:   n/a  Cognitive:   n/a  Insight: None  Engagement in Group:   n/a  Modes of Intervention:   n/a  Additional Comments:   Pt did not attend the morning goals group.  Wetzel Bjornstad Adalynne Steffensmeier 01/01/2023, 10:37 AM

## 2023-01-01 NOTE — Progress Notes (Addendum)
01/01/23 2043  Psych Admission Type (Psych Patients Only)  Admission Status Voluntary  Psychosocial Assessment  Patient Complaints None  Eye Contact Fair  Facial Expression Animated;Anxious  Affect Anxious;Preoccupied  Speech Pressured;Rapid;Tangential  Interaction Childlike;Needy;Hypervigilant  Motor Activity Restless;Fidgety;Hyperactive  Appearance/Hygiene Unremarkable  Behavior Characteristics Unable to participate  Mood Pleasant;Preoccupied;Suspicious  Thought Process  Coherency Disorganized  Content Preoccupation;Paranoia;Delusions  Delusions Grandeur;Referential  Perception Hallucinations  Hallucination Auditory;Visual  Judgment Impaired  Confusion Moderate  Danger to Self  Current suicidal ideation? Denies  Agreement Not to Harm Self Yes  Description of Agreement verbal  Danger to Others  Danger to Others None reported or observed   On assessment, patient is standing in her doorway with complaints of being cold.  Writer encourages patient to adjust the heat.  Patient is alert, but disorganized in her conversation.  Patient denies SI/HI, however when asked if she has any AVH, patient responds saying, "I keep hearing I need to back up, I need to leave the hospital, I've been here 57 hours, my mother is old like 24."  Writer is encouraged to attend group, but is unable to tolerate.  Patient returns to dayroom and sits for a while and can be observed responding to internal stimuli and laughing inappropriately.  Administered scheduled medications and PRNs.  Provided emotional support.  Patient is safe on the unit with Q 15 minute safety checks.

## 2023-01-01 NOTE — Progress Notes (Signed)
Pt A & O to self and day of the week with prompts. Denies SI, HI and pain when assessed. Observed to be very disorganized, confused, tangential with rapid, pressured speech and racing thoughts on interaction. She was actively responding to internal stimuli as well. Stated to Probation officer when asked of hallucinations "Yeah, I was hearing voices on that bench last night so I moved over to the bed, but I'm fine now". However, she continued to report ants all over her room floor and on opposite bed where writer was sitting" believed "there are children from the [facility] on the freeway I need to go get them. There are lots of holes and moles in the walls. I need to leave here, this is not my usual spot". Continues to reports being here for 57 years, "no 26 hours but I've been in here for 57 years too". Pt's became increasing intrusive towards peers & staff, disruptive in milieu, especially during group as she couldn't focus or stay on topic due to being hyper-verbal and restless /fidgety as well. Scheduled medications given (Ativan, Lexapro, Zydis) and PRN Vistaril (See EMAR) . Pt observed asleep in bed at 1315 during safety checks. Support, encouragement and reassurance provided to pt this shift. Q 15 minutes safety checks maintained. Verbal education done on safety, limitations in milieu without effect as she remains disorganized and confused. Tolerates meals and fluids well.

## 2023-01-01 NOTE — [facility] Suicide Risk Assessment (Addendum)
Suicide Risk Assessment  Admission Assessment    [facility] Admission Suicide Risk Assessment   Nursing information obtained from:    Demographic factors:    Current Mental Status:    Loss Factors:    Historical Factors:    Risk Reduction Factors:     Total Time spent with patient: 45 minutes Principal Problem: Psychosis (Rebecca Hoover) Diagnosis:  Principal Problem:   Psychosis (Rebecca Hoover) Active Problems:   Suicidal ideation   Speech impediment   Generalized anxiety disorder   Major depressive disorder, recurrent episode (Rebecca Hoover)   Subjective Data:   History of Present Illness:  Rebecca Hoover is a 22 y.o., female with a past psychiatric history significant for Major depressive disorder, recurrent episode (Rebecca Hoover), GAD, speech impediment, SI who presents to the [facility] from behavioral health urgent care [facility]) for evaluation and management of 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    Initial assessment on 01/01/2023, patient was evaluated on the inpatient unit, the patient had pressured, disorganized, nonsensical speech and was oriented to name only first try, oriented to place on second questioning and month/day but not year. When asked if she knew why she was here she reports "because of the cousins, there's too many cousins" and when asked to elaborate she said "yeah there's Rebecca Hoover and Rebecca Hoover" and later mentioned "Rebecca Hoover." She said there were "too many cousins in the room, over 64" and they were talking to her and "she wanted them out." She repeated several times "I've been here a very long time, over 27 hours." She said she has been in school for "56 years" and has felt sad for "37 years." She also said her mother, Rebecca Hoover, was "crazy" and that she was lying. She also said there were cameras in her house trying to kill her. Due to difficulty with redirection and storytelling ability, collateral was obtained by patient's mother with consent.   Collateral information  obtained from Rebecca Hoover, patient's mother (phone number [phone] )   Per mother, patient started having increasingly disorganized, rapid, nonsensical speech and hearing angry voices last week after restarting the school semester. She also describes the patient's perseveration with a friend named Rebecca Hoover, which mother says she has not seen any evidence of calls/texts from since the patient started talking about him over the summer last year. She says the patient "just snapped" and she cannot think of any recent triggers besides the change in routine from winter break to starting the term. Patient notably has a speech impediment with trouble putting words in order which has been present since childhood, but otherwise has cohesive, linear speech that she is able to understand at baseline (which is not the case currently). Patient also had a panic attack last week at home where she was crying, had chest pain, felt her body go numb, and mother gave her Xanax and another benzo to help her relax. She has had two panic attacks in prior years with similar presentation. Patient did not report a specific trigger for this, but mother believes it was related to a history of anxiety specifically related to doing daily activities at specific times and in specific routines since childhood which was disturbed with the break.    Notably patient's father passed away in 08-Dec-2021, and patient was diagnosed with MDD with suicidal ideation related to this, telling her mother she needed to "put away a knife" sitting on kitchen table. No suicide attempts per mother. She was started on lexapro 20 mg daily which she  continued to admission. She then had continued anxiety/stressors about going to college, fixating on this per mom in Spring of 2023, had trouble with bullying at school, and was unsatisfied with classes at the time with events of hitting herself with this stress.She had a panic attack in the second half of a  concert she attended over the summer, and began talking about a friend named Rebecca Hoover who she said went to school with her and they talked over snapchat, but per mother she has not seen evidence of communication with such a person.    Patient has a history of laughing inappropriately, issues with sleep (average about 4-5 hrs), never been in trouble or hyper (per mom) has had trouble reading, focusing, and a habit of fixating on people and things since childhood. Mom says she has been evaluated over the years by psychologists/therapists and believe she has autism but was never formally diagnosed.     Continued Clinical Symptoms:    The "Alcohol Use Disorders Identification Test", Guidelines for Use in Primary Care, Second Edition.  World Pharmacologist [facility]). Score between 0-7:  no or low risk or alcohol related problems. Score between 8-15:  moderate risk of alcohol related problems. Score between 16-19:  high risk of alcohol related problems. Score 20 or above:  warrants further diagnostic evaluation for alcohol dependence and treatment.   CLINICAL FACTORS:   Severe Anxiety and/or Agitation Panic Attacks Depression:   Anhedonia Currently Psychotic   Musculoskeletal: Strength & Muscle Tone: within normal limits Gait & Station: normal Patient leans: N/A  Psychiatric Specialty Exam: General Appearance: appears at stated age, not well-groomed and tired appearing   Behavior: Agitated   Psychomotor Activity: no psychomotor agitation or retardation noted    Eye Contact: poor Speech: disorganized, pressured, nonsensical, non linear     Mood: euthymic  Affect: congruent,  Thought Process: Nonlinear, non goal directed, racing thoughts and flight of ideas with fixation  Descriptions of Associations: Not intact Thought Content: Disorganized, bizarre, paranoid thoughts Hallucinations: Reports angry voices of cousins, visual hallucinations of over 53 cousins in the rooms and aunts,  appears to be responding to stimuli  Delusions: Paranoid thoughts of cameras trying to kill her, possible delusions  Suicidal Thoughts: denies SI, intention, plan  Homicidal Thoughts: denies HI, intention, plan    Alertness/Orientation: alert and oriented x1   Insight: limited  Judgment: limited   Memory: unable to assess   Executive Functions  Concentration: poor Attention Span: poor Recall: poor Fund of Knowledge: poor  Physical Exam:  Constitutional:      Appearance: Normal appearance.  Cardiovascular:     Rate and Rhythm: Normal rate.  Pulmonary:     Effort: Pulmonary effort is normal.  Neurological:     General: No focal deficit present.     Mental Status: Alert and oriented to person, place, and time.      Review of Systems  Constitutional: Negative.  Negative for chills, fever and weight loss.  HENT: Negative.    Eyes: Negative.   Respiratory: Negative.    Cardiovascular: Negative.   Gastrointestinal:  Positive for constipation, burping/flatulence Genitourinary: Negative.   Musculoskeletal: Negative.   Skin: Negative.   Neurological: Negative.  Negative for tingling.    COGNITIVE FEATURES THAT CONTRIBUTE TO RISK:  Loss of executive function    SUICIDE RISK:   Mild:  Suicidal ideation of limited frequency, intensity, duration, and specificity.  There are no identifiable plans, no associated intent, mild dysphoria and related symptoms, good  self-control (both objective and subjective assessment), few other risk factors, and identifiable protective factors, including available and accessible social support.  PLAN OF CARE: See H&P for assessment and plan.   I certify that inpatient services furnished can reasonably be expected to improve the patient's condition.   Fleeta Emmer, Medical Student 01/01/2023, 1:22 PM

## 2023-01-01 NOTE — Plan of Care (Signed)
  Problem: Health Behavior/Discharge Planning: Goal: Compliance with prescribed medication regimen will improve Outcome: Progressing   Problem: Nutritional: Goal: Ability to achieve adequate nutritional intake will improve Outcome: Progressing

## 2023-01-01 NOTE — Progress Notes (Signed)
The focus of this group is to help patients review their daily goal of treatment and discuss progress on daily workbooks. Pt did not attend the evening group. 

## 2023-01-01 NOTE — Progress Notes (Signed)
Pt awake, pacing in room. Remains disorganized, continues to need frequent verbal redirections. [facility] staff noted that pt's curtains and the rode were off the wall with nails hanging during safety checks. AC made aware. Maintenance was called in and curtains & nails were completely removed for safety. Pt ate 50% of dinner and encouraged to drink more fluids due to elevated HR; she remains fidgety and restless as well. Safety checks continues.
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
## 2023-01-02 ENCOUNTER — Encounter (HOSPITAL_COMMUNITY): Payer: Self-pay

## 2023-01-02 DIAGNOSIS — F29 Unspecified psychosis not due to a substance or known physiological condition: Secondary | ICD-10-CM | POA: Diagnosis not present

## 2023-01-02 DIAGNOSIS — F23 Brief psychotic disorder: Secondary | ICD-10-CM | POA: Diagnosis not present

## 2023-01-02 LAB — RPR: RPR Ser Ql: NONREACTIVE

## 2023-01-02 LAB — URINALYSIS, ROUTINE W REFLEX MICROSCOPIC
Bacteria, UA: NONE SEEN
Bilirubin Urine: NEGATIVE
Glucose, UA: NEGATIVE mg/dL
Hgb urine dipstick: NEGATIVE
Ketones, ur: NEGATIVE mg/dL
Nitrite: NEGATIVE
Protein, ur: NEGATIVE mg/dL
Specific Gravity, Urine: 1.004 — ABNORMAL LOW (ref 1.005–1.030)
pH: 7 (ref 5.0–8.0)

## 2023-01-02 LAB — HEMOGLOBIN A1C
Hgb A1c MFr Bld: 4.8 % (ref 4.8–5.6)
Mean Plasma Glucose: 91.06 mg/dL

## 2023-01-02 LAB — LIPID PANEL
Cholesterol: 128 mg/dL (ref 0–200)
HDL: 54 mg/dL (ref >40)
LDL Cholesterol: 67 mg/dL (ref 0–99)
Total CHOL/HDL Ratio: 2.4 RATIO
Triglycerides: 34 mg/dL (ref <150)
VLDL: 7 mg/dL (ref 0–40)

## 2023-01-02 LAB — VITAMIN B12: Vitamin B-12: 740 pg/mL (ref 180–914)

## 2023-01-02 LAB — FOLATE: Folate: 7.4 ng/mL (ref >5.9)

## 2023-01-02 MED ORDER — RISPERIDONE 1 MG PO TBDP
1.0000 mg | ORAL_TABLET | Freq: Every day | ORAL | Status: AC
  Administered 2023-01-02: 1 mg via ORAL
  Filled 2023-01-02: qty 1

## 2023-01-02 MED ORDER — RISPERIDONE 1 MG PO TABS
1.0000 mg | ORAL_TABLET | Freq: Two times a day (BID) | ORAL | Status: AC
  Administered 2023-01-02: 1 mg via ORAL
  Filled 2023-01-02: qty 1

## 2023-01-02 MED ORDER — RISPERIDONE 2 MG PO TBDP
2.0000 mg | ORAL_TABLET | Freq: Two times a day (BID) | ORAL | Status: DC
  Administered 2023-01-03: 2 mg via ORAL
  Filled 2023-01-02 (×5): qty 1

## 2023-01-02 MED ORDER — PROPRANOLOL HCL 10 MG PO TABS
10.0000 mg | ORAL_TABLET | Freq: Two times a day (BID) | ORAL | Status: DC
  Administered 2023-01-02 – 2023-01-04 (×5): 10 mg via ORAL
  Filled 2023-01-02 (×10): qty 1

## 2023-01-02 MED ORDER — LORAZEPAM 1 MG PO TABS
1.0000 mg | ORAL_TABLET | Freq: Two times a day (BID) | ORAL | Status: DC
  Administered 2023-01-02 – 2023-01-03 (×2): 1 mg via ORAL
  Filled 2023-01-02 (×2): qty 1

## 2023-01-02 MED ORDER — LORAZEPAM 1 MG PO TABS: 2.0000 mg | ORAL_TABLET | Freq: Four times a day (QID) | ORAL | Status: DC | PRN

## 2023-01-02 MED ORDER — ENSURE ENLIVE PO LIQD
237.0000 mL | Freq: Two times a day (BID) | ORAL | Status: DC
  Administered 2023-01-03 – 2023-01-18 (×28): 237 mL via ORAL
  Filled 2023-01-02 (×35): qty 237

## 2023-01-02 NOTE — Group Note (Signed)
Recreation Therapy Group Note   Group Topic:Problem Solving  Group Date: 01/02/2023 Start Time: 1007 End Time: 1040 Facilitators: Sreeja Spies-McCall, LRT,CTRS Location: [address] Dayroom   Goal Area(s) Addresses:  Patient will effectively work with peer towards shared goal.  Patient will identify skills used to make activity successful.  Patient will share challenges and verbalize solution-driven approaches used. Patient will identify how skills used during activity can be used to reach post d/c goals.    Group Description: [employer]. Patients were provided the following materials: 5 drinking straws, 5 rubber bands, 5 paper clips, 2 index cards, and 2 drinking cups. Using the provided materials patients were asked to build a launching mechanism to launch a ping pong ball across the room, approximately 10 feet. Patients were divided into teams of 3-5. Instructions required all materials be incorporated into the device, functionality of items left to the peer group's discretion.   Affect/Mood: Appropriate   Participation Level: Active   Participation Quality: Independent   Behavior: Cooperative and Paranoid   Speech/Thought Process: Irrational, Incoherent, and Pressured   Insight: Impaired   Judgement: Impaired   Modes of Intervention: STEM Activity   Patient Response to Interventions:  Receptive   Education Outcome:  Acknowledges education and In group clarification offered    Clinical Observations/Individualized Feedback: Pt interacted and attempted to help with the launcher.  Peers were patient with her.  Pt would mumble and was incoherent when talking.  Pt was also delusional in caring on conversations with herself.  However, pt had a bright appearance and was smiling when trying to engage with peers and LRT.     Plan: Continue to engage patient in RT group sessions 2-3x/week.   Kadeja Granada-McCall, LRT,CTRS 01/02/2023 12:30 PM

## 2023-01-02 NOTE — Progress Notes (Signed)
Pt visible in milieu at intervals this shift. Attended  scheduled orientation group but required multiple redirections due to intrusiveness, being disorganized and actively responding to internal stimuli; talking to self. Continues to interact with "Tommy" who's not there while pacing in hall. Affect is animated, she's hyper-verbal, fidgety /restless with soft but tangential speech. However, she's able to follow redirections with easy commands and reinforcement. Urine sample obtained this shift. Pt remains medication compliant without issues.  Visible in dayroom for scheduled groups. Support, encouragement and reassurance offered to pt. Safety checks maintained at Q 15 minutes intervals without outburst. Verbal education done on current treatment regimen, understanding verbalized. Pt tolerates meals, fluids and medications well. Attended scheduled groups when prompted. Denies concerns at this time.

## 2023-01-02 NOTE — [facility] Counselor (Signed)
[facility] LCSW Progress Note   01/02/2023    10:08 AM  Rebecca Hoover   [id number]   Type of Contact and Topic: CSW assessment attempt  CSW attempted on 1/16 to complete an assessment.  Patient was manic and disorganized and was unable to complete the assessment.   CSW attempted to complete assessment on 1/17.  Nursing stated that she probably would need to wait til tomorrow to do an appropriate assessment.   CSW will retry assessment tomorrow.    CSW was able to get consents signed for aftercare follow up and collateral call with [name], mother.      Signed:  Riki Altes MSW, LCSW, LCAS 01/02/2023 10:08 AM

## 2023-01-02 NOTE — Group Note (Signed)
[place] LCSW Group Therapy   Type of Therapy and Topic:  Group Therapy:  Wellness   Participation Level: Did Not Attend  Description of Group: This group allows individuals to explore the 6 dimensions of wellness, including spiritual, emotional, intellectual, physical, social, environmental, financial and spiritual. Patients will learn to different ways to practice wellness to improve well-being. Patients also participated in a conversation about what wellness means to them.   Individuals will think about ways in which they currently practice wellness as well as ways they can improve their wellness and new ways to practice wellness.       Therapeutic Goals Patient will verbalize 1 pr 2 we;;mess areas where they are doing well. Patient will identify 2 areas where they would like to improve their wellness.   Patient will provide a definition of what wellness means to them.  Patients will reflect on current hospitalization and primary areas to maintain mental health to prevent re-hospitalization.     Summary of Patient Progress:  x       Therapeutic Modalities Cognitive Behavioral Therapy Motivational Interviewing   Doyl Bitting, LCSW, [place] Social Worker  [facility] [place]

## 2023-01-02 NOTE — Progress Notes (Signed)
01/02/23 2051  Psych Admission Type (Psych Patients Only)  Admission Status Voluntary  Psychosocial Assessment  Patient Complaints None  Eye Contact Fair  Facial Expression Animated;Anxious  Affect Preoccupied;Anxious  Speech Rapid;Pressured;Tangential  Interaction Childlike;Needy;Intrusive  Motor Activity Fidgety;Restless;Pacing  Appearance/Hygiene Improved  Behavior Characteristics Cooperative  Mood Preoccupied;Suspicious;Anxious  Thought Process  Coherency Disorganized  Content Preoccupation;Paranoia  Delusions Grandeur;Referential  Perception Hallucinations  Hallucination Auditory;Visual  Judgment Impaired  Confusion Moderate  Danger to Self  Current suicidal ideation? Denies  Agreement Not to Harm Self Yes  Description of Agreement verbal  Danger to Others  Danger to Others None reported or observed

## 2023-01-02 NOTE — BH IP Treatment Plan (Signed)
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)   Current Medications:  Current Facility-Administered Medications  Medication Dose Route Frequency Provider Last Rate Last Admin   acetaminophen (TYLENOL) tablet 650 mg  650 mg Oral Q6H PRN Evette Georges, NP       alum & mag hydroxide-simeth (MAALOX/MYLANTA) 200-200-20 MG/5ML suspension 30 mL  30 mL Oral Q4H PRN Evette Georges, NP       haloperidol (HALDOL) tablet 5 mg  5 mg Oral TID PRN Janine Limbo, MD       And   LORazepam (ATIVAN) tablet 2 mg  2 mg Oral TID PRN Janine Limbo, MD       And   diphenhydrAMINE (BENADRYL) capsule 50 mg  50 mg Oral TID PRN Massengill, Ovid Curd, MD       haloperidol lactate (HALDOL) injection 5 mg  5 mg Intramuscular TID PRN Massengill, Ovid Curd, MD       And   LORazepam (ATIVAN) injection 2 mg  2 mg Intramuscular TID PRN Massengill, Ovid Curd, MD       And   diphenhydrAMINE (BENADRYL) injection 50 mg  50 mg Intramuscular TID PRN Massengill, Ovid Curd, MD       hydrOXYzine (ATARAX) tablet 25 mg  25 mg Oral TID PRN Janine Limbo, MD   25 mg at 01/02/23 0601   LORazepam (ATIVAN) tablet 1 mg  1 mg Oral Q12H Massengill, Nathan, MD       LORazepam (ATIVAN) tablet 2 mg  2 mg Oral Q6H PRN Massengill, Nathan, MD       magnesium hydroxide (MILK OF MAGNESIA) suspension 30 mL  30 mL Oral Daily PRN Evette Georges, NP       norethindrone-ethinyl estradiol (LOESTRIN) 1-20 MG-MCG tablet 1 tablet  1 tablet Oral Daily Evette Georges, NP       propranolol (INDERAL) tablet 10 mg  10 mg Oral Q12H Massengill, Ovid Curd, MD   10 mg at 01/02/23 0941   [START ON 01/03/2023] risperiDONE  (RISPERDAL M-TABS) disintegrating tablet 2 mg  2 mg Oral Q12H Massengill, Nathan, MD       risperiDONE (RISPERDAL) tablet 1 mg  1 mg Oral Q12H Massengill, Nathan, MD       traZODone (DESYREL) tablet 50 mg  50 mg Oral QHS PRN Massengill, Ovid Curd, MD   50 mg at 01/01/23 2043   PTA Medications: Medications Prior to Admission  Medication Sig Dispense Refill Last Dose   norethindrone-ethinyl estradiol (LOESTRIN) 1-20 MG-MCG tablet Take 1 tablet by mouth daily. (Patient not taking: Reported on 12/30/2022)       Patient Stressors:    Patient Strengths:    Treatment Modalities: Medication Management, Group therapy, Case management,  1 to 1 session with clinician, Psychoeducation, Recreational therapy.   Physician Treatment Plan for Primary Diagnosis: Brief psychotic disorder ([name]) Long Term Goal(s):     Short Term Goals: Ability to identify changes in lifestyle to reduce recurrence of condition will improve Ability to disclose and discuss suicidal ideas Ability to demonstrate self-control will improve Compliance with prescribed medications will improve  Medication Management: Evaluate patient's response, side effects, and tolerance of medication regimen.  Therapeutic Interventions: 1 to 1 sessions, Unit Group sessions and Medication administration.  Evaluation of Outcomes: Not Met  Physician Treatment Plan for Secondary Diagnosis: Principal Problem:   Brief psychotic disorder ([name]) Active Problems:   Suicidal ideation   Speech impediment   Generalized anxiety disorder   Major depressive disorder, recurrent episode ([name])  Long Term Goal(s):     Short Term Goals: Ability to identify changes in lifestyle to reduce recurrence of condition will improve Ability to disclose and discuss suicidal ideas Ability to demonstrate self-control will improve Compliance with prescribed medications will improve     Medication Management: Evaluate patient's response, side effects, and tolerance of  medication regimen.  Therapeutic Interventions: 1 to 1 sessions, Unit Group sessions and Medication administration.  Evaluation of Outcomes: Not Met   RN Treatment Plan for Primary Diagnosis: Brief psychotic disorder ([name]) Long Term Goal(s): Knowledge of disease and therapeutic regimen to maintain health will improve  Short Term Goals: Ability to remain free from injury will improve, Ability to participate in decision making will improve, Ability to verbalize feelings will improve, Ability to disclose and discuss suicidal ideas, and Ability to identify and develop effective coping behaviors will improve  Medication Management: RN will administer medications as ordered by provider, will assess and evaluate patient's response and provide education to patient for prescribed medication. RN will report any adverse and/or side effects to prescribing provider.  Therapeutic Interventions: 1 on 1 counseling sessions, Psychoeducation, Medication administration, Evaluate responses to treatment, Monitor vital signs and CBGs as ordered, Perform/monitor CIWA, COWS, AIMS and Fall Risk screenings as ordered, Perform wound care treatments as ordered.  Evaluation of Outcomes: Not Met   LCSW Treatment Plan for Primary Diagnosis: Brief psychotic disorder [name]) Long Term Goal(s): Safe transition to appropriate next level of care at discharge, Engage patient in therapeutic group addressing interpersonal concerns.  Short Term Goals: Engage patient in aftercare planning with referrals and resources, Increase social support, Increase emotional regulation, Facilitate acceptance of mental health diagnosis and concerns, Identify triggers associated with mental health/substance abuse issues, and Increase skills for wellness and recovery  Therapeutic Interventions: Assess for all discharge needs, 1 to 1 time with Social worker, Explore available resources and support systems, Assess for adequacy in community support  network, Educate family and significant other(s) on suicide prevention, Complete Psychosocial Assessment, Interpersonal group therapy.  Evaluation of Outcomes: Not Met   Progress in Treatment: Attending groups: Yes. Participating in groups: Yes. Taking medication as prescribed: Yes. Toleration medication: Yes. Family/Significant other contact made: Yes, individual(s) contacted:  Mother  Patient understands diagnosis: No. Discussing patient identified problems/goals with staff: Yes. Medical problems stabilized or resolved: Yes. Denies suicidal/homicidal ideation: Yes. Issues/concerns per patient self-inventory: No.   New problem(s) identified: No, Describe:  None reported   New Short Term/Long Term Goal(s): medication stabilization, elimination of SI thoughts, development of comprehensive mental wellness plan.   Patient Goals: "I want to go back to school"   Discharge Plan or Barriers: Patient recently admitted. CSW will continue to follow and assess for appropriate referrals and possible discharge planning.   Reason for Continuation of Hospitalization: Anxiety Delusions  Hallucinations Medication stabilization  Estimated Length of Stay: 3 to 7 days   Last 3 [place] Suicide Severity Risk Score: [place] ED from 12/31/2022 in [place] ED from 12/29/2022 in [facility] ED from 01/20/2022 in [place] DEPT  C-SSRS RISK CATEGORY No Risk No Risk No Risk       Last PHQ 2/9 Scores:  No data to display          Scribe for Treatment Team: Carney Harder 01/02/2023 1:51 PM

## 2023-01-02 NOTE — Progress Notes (Signed)
  Administered PRN Hydroxyzine and Trazodone per [facility] per patient request.

## 2023-01-02 NOTE — Progress Notes (Signed)
  Rechecked patient pulse, post Hydroxyzine administration.  Patient's pulse up to the mid 150s while standing.  Provider notified.  Writer took manual pulse. Patients pulse recorded at 108.  Will continue to monitor and refer to day shift to continue to monitor.  Patient is in no distress.  Is sitting calmly but continues responding to internal stimuli.  Patient is safe on the unit with Q 15 minute safety checks.

## 2023-01-02 NOTE — Progress Notes (Addendum)
[facility] MD Progress Note  01/02/2023 7:38 AM Rebecca Hoover  MRN:  [id number]   Reason for Admission:  Rebecca Hoover is a 22 y.o. female with a history of  Major depressive disorder, recurrent episode ([place]), GAD, speech impediment, SI , who was initially admitted for inpatient psychiatric hospitalization on 01/01/2023 for management of brief psychotic disorder. The patient is currently on Hospital Day 1.   Chart Review from last 24 hours:  The patient's chart was reviewed and nursing notes were reviewed. The patient's case was discussed in multidisciplinary team meeting. Per [facility] [place], patient was taking medications appropriately. Per nursing, pt says "Yeah, I was hearing voices on that bench last night so I moved over to the bed, but I'm fine now". However, she continued to report ants all over her room floor and on opposite bed where writer was sitting" believed "there are children from the [facility] on the freeway I need to go get them. There are lots of holes and moles in the walls. I need to leave here, this is not my usual spot". Continues to reports being here for 57 years, "no 26 hours but I've been in here for 57 years too". Pt's became increasing intrusive towards peers & staff, disruptive in milieu, especially during group as she couldn't focus or stay on topic due to being hyper-verbal and restless /fidgety as well. Pt attended 1 group session. Patient received the following PRN medications: atarax 3x, ativan 1x at 2 am, trazodone.  Information Obtained Today During Patient Interview: The patient was seen and evaluated on the unit. On assessment today the patient has more coherent thought content and linear speech compared to yesterday although still tangential and pressured. She was oriented to name, location and date and when asked why she was here she said "Yes because I wasn't acting right because of my cousins." She reported she slept well, but said she had a nightmare about her mother dying. She  replied positively to trying to talk to her mother today. She also reported she ate well, listing some items. When told she was started on medication for her symptoms she responded "Yes, yes, the medications are helping." She still reported AVH, hearing voices and seeing multiple people. Also laughed inappropriately. No SI/HI.     Principal Problem: Brief psychotic disorder ([place]) Diagnosis: Principal Problem:   Brief psychotic disorder ([place]) Active Problems:   Suicidal ideation   Speech impediment   Generalized anxiety disorder   Major depressive disorder, recurrent episode ([place])    Past Psychiatric History: MDD  Past Medical History: No past medical history on file. No past surgical history on file. Family History: No family history on file. Family Psychiatric  History: Bipolar Disorder Social History:   Sleep:  Fair  Appetite:  Fair  Current Medications: Current Facility-Administered Medications  Medication Dose Route Frequency Provider Last Rate Last Admin   acetaminophen (TYLENOL) tablet 650 mg  650 mg Oral Q6H PRN Evette Georges, NP       alum & mag hydroxide-simeth (MAALOX/MYLANTA) 200-200-20 MG/5ML suspension 30 mL  30 mL Oral Q4H PRN Evette Georges, NP       haloperidol (HALDOL) tablet 5 mg  5 mg Oral TID PRN Janine Limbo, MD       And   LORazepam (ATIVAN) tablet 2 mg  2 mg Oral TID PRN Massengill, Ovid Curd, MD       And   diphenhydrAMINE (BENADRYL) capsule 50 mg  50 mg Oral TID PRN Janine Limbo, MD  haloperidol lactate (HALDOL) injection 5 mg  5 mg Intramuscular TID PRN Massengill, Ovid Curd, MD       And   LORazepam (ATIVAN) injection 2 mg  2 mg Intramuscular TID PRN Massengill, Ovid Curd, MD       And   diphenhydrAMINE (BENADRYL) injection 50 mg  50 mg Intramuscular TID PRN Massengill, Ovid Curd, MD       escitalopram (LEXAPRO) tablet 5 mg  5 mg Oral Daily Coralyn Pear B, MD       hydrOXYzine (ATARAX) tablet 25 mg  25 mg Oral TID PRN Janine Limbo, MD    25 mg at 01/02/23 0601   LORazepam (ATIVAN) tablet 0.5 mg  0.5 mg Oral Q12H Coralyn Pear B, MD   0.5 mg at 01/01/23 2043   LORazepam (ATIVAN) tablet 1 mg  1 mg Oral Q6H PRN Massengill, Ovid Curd, MD   1 mg at 01/02/23 0252   magnesium hydroxide (MILK OF MAGNESIA) suspension 30 mL  30 mL Oral Daily PRN Evette Georges, NP       norethindrone-ethinyl estradiol (LOESTRIN) 1-20 MG-MCG tablet 1 tablet  1 tablet Oral Daily Evette Georges, NP       risperiDONE (RISPERDAL) tablet 1 mg  1 mg Oral Q12H Coralyn Pear B, MD   1 mg at 01/01/23 2043   traZODone (DESYREL) tablet 50 mg  50 mg Oral QHS PRN Janine Limbo, MD   50 mg at 01/01/23 2043    Lab Results:  Results for orders placed or performed during the hospital encounter of 12/31/22 (from the past 48 hour(s))  Resp panel by RT-PCR (RSV, Flu A&B, Covid) Anterior Nasal Swab     Status: None   Collection Time: 12/31/22 10:36 PM   Specimen: Anterior Nasal Swab  Result Value Ref Range   SARS Coronavirus 2 by RT PCR NEGATIVE NEGATIVE    Comment: (NOTE) SARS-CoV-2 target nucleic acids are NOT DETECTED.  The SARS-CoV-2 RNA is generally detectable in upper respiratory specimens during the acute phase of infection. The lowest concentration of SARS-CoV-2 viral copies this assay can detect is 138 copies/mL. A negative result does not preclude SARS-Cov-2 infection and should not be used as the sole basis for treatment or other patient management decisions. A negative result may occur with  improper specimen collection/handling, submission of specimen other than nasopharyngeal swab, presence of viral mutation(s) within the areas targeted by this assay, and inadequate number of viral copies(<138 copies/mL). A negative result must be combined with clinical observations, patient history, and epidemiological information. The expected result is Negative.  Fact Sheet for Patients:  EntrepreneurPulse.com.au  Fact Sheet for Healthcare  Providers:  IncredibleEmployment.be  This test is no t yet approved or cleared by the [place] FDA and  has been authorized for detection and/or diagnosis of SARS-CoV-2 by FDA under an Emergency Use Authorization (EUA). This EUA will remain  in effect (meaning this test can be used) for the duration of the COVID-19 declaration under Section 564(b)(1) of the Act, 21 U.S.C.section 360bbb-3(b)(1), unless the authorization is terminated  or revoked sooner.       Influenza A by PCR NEGATIVE NEGATIVE   Influenza B by PCR NEGATIVE NEGATIVE    Comment: (NOTE) The Xpert Xpress SARS-CoV-2/FLU/RSV plus assay is intended as an aid in the diagnosis of influenza from Nasopharyngeal swab specimens and should not be used as a sole basis for treatment. Nasal washings and aspirates are unacceptable for Xpert Xpress SARS-CoV-2/FLU/RSV testing.  Fact Sheet for Patients: EntrepreneurPulse.com.au  Fact Sheet  for Healthcare Providers: IncredibleEmployment.be  This test is not yet approved or cleared by the [place] and has been authorized for detection and/or diagnosis of SARS-CoV-2 by FDA under an Emergency Use Authorization (EUA). This EUA will remain in effect (meaning this test can be used) for the duration of the COVID-19 declaration under Section 564(b)(1) of the Act, 21 U.S.C. section 360bbb-3(b)(1), unless the authorization is terminated or revoked.     Resp Syncytial Virus by PCR NEGATIVE NEGATIVE    Comment: (NOTE) Fact Sheet for Patients: EntrepreneurPulse.com.au  Fact Sheet for Healthcare Providers: IncredibleEmployment.be  This test is not yet approved or cleared by the [place] FDA and has been authorized for detection and/or diagnosis of SARS-CoV-2 by FDA under an Emergency Use Authorization (EUA). This EUA will remain in effect (meaning this test can be used) for the  duration of the COVID-19 declaration under Section 564(b)(1) of the Act, 21 U.S.C. section 360bbb-3(b)(1), unless the authorization is terminated or revoked.  Performed at [facility], [place] [address], [place], [place] [address]     Blood Alcohol level:  Lab Results  Component Value Date   ETH <10 22/29/7989    Metabolic Disorder Labs: Lab Results  Component Value Date   HGBA1C 4.7 (L) 12/29/2022   MPG 88.19 12/29/2022   No results found for: "PROLACTIN" Lab Results  Component Value Date   CHOL 168 12/29/2022   TRIG 45 12/29/2022   HDL 71 12/29/2022   CHOLHDL 2.4 12/29/2022   VLDL 9 12/29/2022   LDLCALC 88 12/29/2022    Physical Findings: AIMS: Facial and Oral Movements Muscles of Facial Expression: None, normal Lips and Perioral Area: None, normal Jaw: None, normal Tongue: None, normal,Extremity Movements Upper (arms, wrists, hands, fingers): None, normal Lower (legs, knees, ankles, toes): None, normal, Trunk Movements Neck, shoulders, hips: None, normal, Overall Severity Severity of abnormal movements (highest score from questions above): None, normal Incapacitation due to abnormal movements: None, normal Patient's awareness of abnormal movements (rate only patient's report): No Awareness, Dental Status Current problems with teeth and/or dentures?: No Does patient usually wear dentures?: No  CIWA:    COWS:     Musculoskeletal: Strength & Muscle Tone: within normal limits Gait & Station: normal Patient leans: N/A  Psychiatric Specialty Exam:  General Appearance: appears at stated age, not well-groomed and tired appearing   Behavior: Agitated   Psychomotor Activity: no psychomotor agitation or retardation noted    Eye Contact: poor Speech: disorganized, slightly pressured, non linear, tangential     Mood: expansive Affect: congruent,  Thought Process: Nonlinear, non goal directed, racing thoughts and flight of ideas with fixation   Descriptions of Associations: Not intact Thought Content: Disorganized, bizarre, paranoid thoughts Hallucinations: Reports angry voices of cousins, visual hallucinations of over 8 cousins in the rooms and aunts, appears to be responding to stimuli  Delusions: Paranoid thoughts of cameras trying to kill her, possible delusions  Suicidal Thoughts: denies SI, intention, plan  Homicidal Thoughts: denies HI, intention, plan    Alertness/Orientation: alert and oriented x3 to name, date, place  Insight: limited  Judgment: limited   Memory: poor   Community education officer  Concentration: poor Attention Span: poor Recall: poor Fund of Knowledge: poor    Animal nutritionist; Desire for Improvement; Resilience; Leisure Time    Physical Exam: Constitutional:      Appearance: Normal appearance.  Cardiovascular:     Rate and Rhythm: Normal rate.  Pulmonary:     Effort:  Pulmonary effort is normal.  Neurological:     General: No focal deficit present.     Mental Status: Alert and oriented to person, place, and time.    Review of Systems  Constitutional: Negative.  Negative for chills, fever and weight loss.  HENT: Negative.    Eyes: Negative.   Respiratory: Negative.    Cardiovascular: Negative.   Gastrointestinal:  Negative for constipation, diarrhea, nausea and vomiting.  Genitourinary: Negative.   Musculoskeletal: Negative.   Skin: Negative.   Neurological: Negative.  Negative for tingling.    ASSESSMENT:  Diagnoses / Active Problems: Principal Problem: Brief psychotic disorder ([place]) Diagnosis: Principal Problem:   Brief psychotic disorder ([place]) Active Problems:   Suicidal ideation   Speech impediment   Generalized anxiety disorder   Major depressive disorder, recurrent episode ([place])   PLAN: Safety and Monitoring:             -- Involuntary admission to inpatient psychiatric unit for safety, stabilization and treatment             -- Daily contact with  patient to assess and evaluate symptoms and progress in treatment             -- Patient's case to be discussed in multi-disciplinary team meeting             -- Observation Level : q15 minute checks             -- Vital signs:  q12 hours             -- Precautions: suicide, elopement, and assault   2. Medications:               Psychiatric Diagnosis and Treatment   Brief Psychotic Disorder of unknown origin - Titrate Risperdal to 1 mg q8H (from 1 mg q12H)   -on 1/18, titrate to 2 mg q12H - Increase Ativan to 1 mg q12H for psychosis agitation - Ordered first psychotic break labs   Generalized Anxiety Disorder - Start propranolol 10 mg BID - Increase Ativan to 2 mg q6H PRN if unresponsive to Atarax - Atarax 25 mg TID oral as needed for anxiety  Major Depressive Disorder - Continue Lexapro taper:  5 mg today and discontinued after due to concern for possible bipolar disorder   Agitation Protocol Benadryl 50 mg TID IV and oral as needed for agitation Haldol 5 mg TID IV and oral as needed for agitation Ativan 2 mg TID IM and oral as needed for agitation   Other as needed medications  Trazodone 50 mg at bedtime as needed for insomnia   Patient does not need nicotine replacement   Medical Diagnosis and Treatment   Abnormal Urine Analysis - UA from the ED positive for large leukocytes, rare bacteria. Will repeat due to concern for UTI   Other as needed medications  Tylenol 650 mg every 6 hours as needed for pain Mylanta 30 mL every 4 hours as needed for indigestion Milk of magnesia 30 mL daily as needed for constipation     The risks/benefits/side-effects/alternatives to the above medication were discussed in detail with the patient and time was given for questions. The patient consents to medication trial. FDA black box warnings, if present, were discussed.   The patient is agreeable with the medication plan, as above. We will monitor the patient's response to pharmacologic  treatment, and adjust medications as necessary.   3. Routine and other pertinent labs: EKG monitoring: QTc: 444  Metabolism / endocrine: BMI: Body mass index is 26.61 kg/m.   Pending: Repeat UA N-methyl-D-Aspartate  Heavy metals ANA w/Reflex if Positive HIV-1 RNA  RPR Ceruloplasmin Folate Vitamin B1 Vitamin B12   A1c   Results: Lipid Panel WNL CBC: Normal CMP: Normal TSH: Normal UDS: Positive for benzodiazepines (reported given by mother for panic attack last week)   4. Group Therapy:             -- Encouraged patient to participate in unit milieu and in scheduled group therapies              -- Short Term Goals: Ability to identify changes in lifestyle to reduce recurrence of condition will improve, Ability to disclose and discuss suicidal ideas, Ability to demonstrate self-control will improve, and Compliance with prescribed medications will improve             -- Long Term Goals: Improvement in symptoms so as ready for discharge -- Patient is encouraged to participate in group therapy while admitted to the psychiatric unit. -- We will address other chronic and acute stressors, which contributed to the patient's Brief psychotic disorder ([place]) in order to reduce the risk of self-harm at discharge.   5. Discharge Planning:              -- Social work and case management to assist with discharge planning and identification of hospital follow-up needs prior to discharge             -- Estimated LOS: 5-7 days             -- Discharge Concerns: Need to establish a safety plan; Medication compliance and effectiveness             -- Discharge Goals: Return home with outpatient referrals for mental health follow-up including medication management/psychotherapy       Total Time Spent in Direct Patient Care:  I personally spent 20 minutes on the unit in direct patient care. The direct patient care time included face-to-face time with the patient, reviewing the patient's chart,  communicating with other professionals, and coordinating care. Greater than 50% of this time was spent in counseling or coordinating care with the patient regarding goals of hospitalization, psycho-education, and discharge planning needs.   Fleeta Emmer, Medical Student  Coralyn Pear PGY-1 Psychiatry 01/02/2023, 7:38 AM

## 2023-01-02 NOTE — Plan of Care (Signed)
  Problem: Coping: Goal: Will verbalize feelings Outcome: Progressing   Problem: Nutritional: Goal: Ability to achieve adequate nutritional intake will improve Outcome: Progressing

## 2023-01-02 NOTE — Progress Notes (Signed)
  Patient is anxious and unable to settle down for sleep. Administered PRN Ativan per [facility] per patient request.

## 2023-01-02 NOTE — Progress Notes (Signed)
Recreation Therapy Notes  1.17.24  On yesterday 1.16.24, LRT attempted to complete assessment with pt.  LRT was unable to complete assessment due to pt paranoid and manic behavior.  LRT will attempt to complete assessment at a later time.      Jatara Huettner-McCall, LRT,CTRS  Amarii Bordas A Alexzia Kasler-McCall 01/02/2023 8:48 AM

## 2023-01-02 NOTE — BHH Group Notes (Signed)
Adult Psychoeducational Group Note  Date:  01/02/2023 Time:  9:24 AM  Group Topic/Focus:  Goals Group:   The focus of this group is to help patients establish daily goals to achieve during treatment and discuss how the patient can incorporate goal setting into their daily lives to aide in recovery.  Participation Level:  Active  Participation Quality:  Drowsy  Affect:  Lethargic  Cognitive:  Disorganized  Insight: Lacking  Engagement in Group:  Lacking  Modes of Intervention:  Reality Testing   Dub Mikes 01/02/2023, 9:24 AM

## 2023-01-02 NOTE — BHH Group Notes (Signed)
Pt attended wrap up group 

## 2023-01-02 NOTE — Progress Notes (Signed)
   01/02/23 0540  15 Minute Checks  Location Bedroom  Visual Appearance Calm  Behavior Composed  Sleep (Behavioral Health Patients Only)  Calculate sleep? (Click Yes once per 24 hr at 0600 safety check) Yes  Documented sleep last 24 hours 8.5

## 2023-01-02 NOTE — Progress Notes (Addendum)
01/02/23 0600  Vital Signs  Pulse Rate (!) 121  Pulse Rate Source Monitor  BP 113/87  BP Location Right Arm  BP Method Automatic  Patient Position (if appropriate) Sitting  Oxygen Therapy  SpO2 100 %      01/02/23 0601  Vital Signs  Pulse Rate (!) 140  Pulse Rate Source Monitor  BP 105/79  BP Location Right Arm  BP Method Automatic  Patient Position (if appropriate) Standing  Oxygen Therapy  SpO2 100 %    Patient presents with moderate anxiety and moves and/or talks during vital signs.  Administered PRN Hydroxyzine per MAR per patient request.  Will recheck pulse.

## 2023-01-03 DIAGNOSIS — F23 Brief psychotic disorder: Secondary | ICD-10-CM | POA: Diagnosis not present

## 2023-01-03 DIAGNOSIS — F29 Unspecified psychosis not due to a substance or known physiological condition: Secondary | ICD-10-CM | POA: Diagnosis not present

## 2023-01-03 DIAGNOSIS — R45851 Suicidal ideations: Secondary | ICD-10-CM | POA: Diagnosis not present

## 2023-01-03 LAB — ANA W/REFLEX IF POSITIVE: Anti Nuclear Antibody (ANA): NEGATIVE

## 2023-01-03 LAB — HIV-1 RNA QUANT-NO REFLEX-BLD
HIV 1 RNA Quant: <20 copies/mL
LOG10 HIV-1 RNA: UNDETERMINED log10copy/mL

## 2023-01-03 LAB — CERULOPLASMIN: Ceruloplasmin: 20.6 mg/dL (ref 19.0–39.0)

## 2023-01-03 MED ORDER — RISPERIDONE 2 MG PO TBDP
2.0000 mg | ORAL_TABLET | Freq: Every day | ORAL | Status: DC
  Filled 2023-01-03: qty 1

## 2023-01-03 MED ORDER — LORAZEPAM 1 MG PO TABS: 1.0000 mg | ORAL_TABLET | Freq: Four times a day (QID) | ORAL | Status: DC | PRN

## 2023-01-03 MED ORDER — LORAZEPAM 0.5 MG PO TABS
0.5000 mg | ORAL_TABLET | Freq: Two times a day (BID) | ORAL | Status: DC
  Administered 2023-01-03 – 2023-01-04 (×2): 0.5 mg via ORAL
  Filled 2023-01-03 (×2): qty 1

## 2023-01-03 MED ORDER — RISPERIDONE 2 MG PO TBDP
2.0000 mg | ORAL_TABLET | Freq: Every day | ORAL | Status: DC
  Administered 2023-01-04 – 2023-01-07 (×4): 2 mg via ORAL
  Filled 2023-01-03 (×6): qty 1

## 2023-01-03 MED ORDER — RISPERIDONE 1 MG PO TBDP
1.0000 mg | ORAL_TABLET | Freq: Two times a day (BID) | ORAL | Status: DC
  Administered 2023-01-03 – 2023-01-07 (×9): 1 mg via ORAL
  Filled 2023-01-03 (×14): qty 1

## 2023-01-03 MED ORDER — RISPERIDONE 1 MG PO TBDP
1.0000 mg | ORAL_TABLET | Freq: Every day | ORAL | Status: AC
  Administered 2023-01-03: 1 mg via ORAL
  Filled 2023-01-03: qty 1

## 2023-01-03 NOTE — Progress Notes (Signed)
Adult Psychoeducational Group Note  Date:  01/03/2023 Time:  10:53 AM  Group Topic/Focus:  Goals Group:   The focus of this group is to help patients establish daily goals to achieve during treatment and discuss how the patient can incorporate goal setting into their daily lives to aide in recovery.  Participation Level:  Active  Participation Quality:  Appropriate  Affect:  Appropriate  Cognitive:  Appropriate  Insight: Appropriate  Engagement in Group:  Engaged  Modes of Intervention:  Discussion  Additional Comments:  Patient attended morning orientation group and participated.  Gwendelyn Lanting W Sharea Guinther 07/03/5500, 10:53 AM

## 2023-01-03 NOTE — BHH Counselor (Signed)
Adult Comprehensive Assessment  Patient ID: Rebecca Hoover, female   DOB: 12-23-2000, 22 y.o.   MRN: [id number]    CSW was unable to perform an assessment with patient due to acute pychosis.  CSW was able to speak with her mother for some background information and was able to do a chart review for additional information.  Due to attempting 3x, CSW had to complete summary assessment.   Summary/Recommendations:   Summary and Recommendations (to be completed by the evaluator): Rebecca Hoover is a 22 year old female who was admitted to [facility] for disorganization, pressured speech, tangential thoughts, delusions and hallucinations.  This is the first psychiatric event for patient.  Patient unable to participate due to acute psychosis and mania.  CSW was able to speak with mother who reports that patient has a history of some depression after the death of her father a year ago. Patient was prescribed lexapro from her primary care provider.  Mother states that change of schedules causes increased anxiety for patient.  Patient started presenting current symptoms after going back to school last week. Mother has suspected a possible autism diagnosis however patient has never been formally diagnosed.  Patient does not have outpatient mental health providers.  Rebecca Hoover E Fread Kottke. 01/03/2023

## 2023-01-03 NOTE — Progress Notes (Deleted)
[facility] MD Progress Note  01/03/2023 7:46 AM Rebecca Hoover  MRN:  [id number]   Reason for Admission:  Major depressive disorder, recurrent episode ([place]), GAD, speech impediment, SI , who was initially admitted for inpatient psychiatric hospitalization on 01/01/2023 for management of brief psychotic disorder. The patient is currently on Hospital Day 2.   Chart Review from last 24 hours:  The patient's chart was reviewed and nursing notes were reviewed. The patient's case was discussed in multidisciplinary team meeting. Per [facility], patient was taking medications appropriately. Per nursing, patient is often anxious, paranoid and disorganized, attended 2 groups. Had to be redirected often and continued to respond to internal stimuli, someone named "Tommy" while pacing in the hall. Reported hyper-verbal, fidgety /restless with soft but tangential speech. However, she's able to follow redirections with easy commands and reinforcement  Patient received the following PRN medications: atarax, trazodone  Information Obtained Today During Patient Interview: The patient was seen and evaluated on the unit. On assessment today the patient appears calmer and more aware but continues to have pressured, disorganized speech and thought content. Reported her mother visited her yesterday. Continues to report angry, screaming voices everywhere which make her anxious, and when asked how many people were in the room she said "there were [phone] people in the room."     Principal Problem: Brief psychotic disorder ([place]) Diagnosis: Principal Problem:   Brief psychotic disorder ([place]) Active Problems:   Suicidal ideation   Speech impediment   Generalized anxiety disorder   Major depressive disorder, recurrent episode ([place])    Past Psychiatric History: MDD   Past Medical History: No past medical history on file. No past surgical history on file. Family History: No family history on file. Family Psychiatric  History:  Bipolar Disorder Social History:   Sleep: 5.5 hours: Poor  Appetite:  Fair  Current Medications: Current Facility-Administered Medications  Medication Dose Route Frequency Provider Last Rate Last Admin   acetaminophen (TYLENOL) tablet 650 mg  650 mg Oral Q6H PRN Evette Georges, NP       alum & mag hydroxide-simeth (MAALOX/MYLANTA) 200-200-20 MG/5ML suspension 30 mL  30 mL Oral Q4H PRN Evette Georges, NP       haloperidol (HALDOL) tablet 5 mg  5 mg Oral TID PRN Janine Limbo, MD       And   LORazepam (ATIVAN) tablet 2 mg  2 mg Oral TID PRN Janine Limbo, MD       And   diphenhydrAMINE (BENADRYL) capsule 50 mg  50 mg Oral TID PRN Massengill, Ovid Curd, MD       haloperidol lactate (HALDOL) injection 5 mg  5 mg Intramuscular TID PRN Massengill, Ovid Curd, MD       And   LORazepam (ATIVAN) injection 2 mg  2 mg Intramuscular TID PRN Massengill, Ovid Curd, MD       And   diphenhydrAMINE (BENADRYL) injection 50 mg  50 mg Intramuscular TID PRN Massengill, Ovid Curd, MD       feeding supplement (ENSURE ENLIVE / ENSURE PLUS) liquid 237 mL  237 mL Oral BID BM Nkwenti, Doris, NP       hydrOXYzine (ATARAX) tablet 25 mg  25 mg Oral TID PRN Janine Limbo, MD   25 mg at 01/02/23 2355   LORazepam (ATIVAN) tablet 1 mg  1 mg Oral Q12H Massengill, Nathan, MD   1 mg at 01/02/23 2051   LORazepam (ATIVAN) tablet 2 mg  2 mg Oral Q6H PRN Janine Limbo, MD  magnesium hydroxide (MILK OF MAGNESIA) suspension 30 mL  30 mL Oral Daily PRN Evette Georges, NP       norethindrone-ethinyl estradiol (LOESTRIN) 1-20 MG-MCG tablet 1 tablet  1 tablet Oral Daily Evette Georges, NP       propranolol (INDERAL) tablet 10 mg  10 mg Oral Q12H Massengill, Ovid Curd, MD   10 mg at 01/02/23 2051   risperiDONE (RISPERDAL M-TABS) disintegrating tablet 2 mg  2 mg Oral Q12H Massengill, Ovid Curd, MD       traZODone (DESYREL) tablet 50 mg  50 mg Oral QHS PRN Janine Limbo, MD   50 mg at 01/02/23 2050    Lab Results:  Results  for orders placed or performed during the hospital encounter of 01/01/23 (from the past 48 hour(s))  Urinalysis, Routine w reflex microscopic Urine, Clean Catch     Status: Abnormal   Collection Time: 01/01/23 12:55 PM  Result Value Ref Range   Color, Urine STRAW (A) YELLOW   APPearance CLEAR CLEAR   Specific Gravity, Urine 1.004 (L) 1.005 - 1.030   pH 7.0 5.0 - 8.0   Glucose, UA NEGATIVE NEGATIVE mg/dL   Hgb urine dipstick NEGATIVE NEGATIVE   Bilirubin Urine NEGATIVE NEGATIVE   Ketones, ur NEGATIVE NEGATIVE mg/dL   Protein, ur NEGATIVE NEGATIVE mg/dL   Nitrite NEGATIVE NEGATIVE   Leukocytes,Ua TRACE (A) NEGATIVE   RBC / HPF 0-5 0 - 5 RBC/hpf   WBC, UA 0-5 0 - 5 WBC/hpf   Bacteria, UA NONE SEEN NONE SEEN   Squamous Epithelial / HPF 0-5 0 - 5 /HPF    Comment: Performed at [facility], [place] [address] [place], [place] [address]  Vitamin B12     Status: None   Collection Time: 01/02/23  7:13 AM  Result Value Ref Range   Vitamin B-12 740 180 - 914 pg/mL    Comment: (NOTE) This assay is not validated for testing neonatal or myeloproliferative syndrome specimens for Vitamin B12 levels. Performed at [facility], [place] [address] [place], [place] [address]   Folate     Status: None   Collection Time: 01/02/23  7:13 AM  Result Value Ref Range   Folate 7.4 >5.9 ng/mL    Comment: Performed at [facility], [place] [address], [place], [place] [address]  RPR     Status: None   Collection Time: 01/02/23  7:13 AM  Result Value Ref Range   RPR Ser Ql NON REACTIVE NON REACTIVE    Comment: Performed at [facility] [address], [place], [place] [address]  Hemoglobin A1c     Status: None   Collection Time: 01/02/23  7:13 AM  Result Value Ref Range   Hgb A1c MFr Bld 4.8 4.8 - 5.6 %    Comment: (NOTE) Pre diabetes:          5.7%-6.4%  Diabetes:              >6.4%  Glycemic control for   <7.0% adults with diabetes    Mean  Plasma Glucose 91.06 mg/dL    Comment: Performed at [facility] [address] [place], [place] [address]  Lipid panel     Status: None   Collection Time: 01/02/23  7:13 AM  Result Value Ref Range   Cholesterol 128 0 - 200 mg/dL   Triglycerides 34 <150 mg/dL   HDL 54 >40 mg/dL   Total CHOL/HDL Ratio 2.4 RATIO   VLDL  7 0 - 40 mg/dL   LDL Cholesterol 67 0 - 99 mg/dL    Comment:        Total Cholesterol/HDL:CHD Risk Coronary Heart Disease Risk Table                     Men   Women  1/2 Average Risk   3.4   3.3  Average Risk       5.0   4.4  2 X Average Risk   9.6   7.1  3 X Average Risk  23.4   11.0        Use the calculated Patient Ratio above and the CHD Risk Table to determine the patient's CHD Risk.        ATP III CLASSIFICATION (LDL):  <100     mg/dL   Optimal  100-129  mg/dL   Near or Above                    Optimal  130-159  mg/dL   Borderline  160-189  mg/dL   High  >190     mg/dL   Very High Performed at [facility] [address] [place], [place] [address]     Blood Alcohol level:  Lab Results  Component Value Date   ETH <10 62/83/1517    Metabolic Disorder Labs: Lab Results  Component Value Date   HGBA1C 4.8 01/02/2023   MPG 91.06 01/02/2023   MPG 88.19 12/29/2022   No results found for: "PROLACTIN" Lab Results  Component Value Date   CHOL 128 01/02/2023   TRIG 34 01/02/2023   HDL 54 01/02/2023   CHOLHDL 2.4 01/02/2023   VLDL 7 01/02/2023   LDLCALC 67 01/02/2023   LDLCALC 88 12/29/2022    Physical Findings: AIMS: Facial and Oral Movements Muscles of Facial Expression: None, normal Lips and Perioral Area: None, normal Jaw: None, normal Tongue: None, normal,Extremity Movements Upper (arms, wrists, hands, fingers): None, normal Lower (legs, knees, ankles, toes): None, normal, Trunk Movements Neck, shoulders, hips: None, normal, Overall Severity Severity of abnormal movements (highest score from questions above):  None, normal Incapacitation due to abnormal movements: None, normal Patient's awareness of abnormal movements (rate only patient's report): No Awareness, Dental Status Current problems with teeth and/or dentures?: No Does patient usually wear dentures?: No  CIWA:    COWS:     Musculoskeletal: Strength & Muscle Tone: within normal limits Gait & Station: normal Patient leans: N/A  Psychiatric Specialty Exam:  General Appearance: appears at stated age, not well-groomed and tired appearing   Behavior: Agitated   Psychomotor Activity: no psychomotor agitation or retardation noted    Eye Contact: poor Speech: disorganized, slightly pressured, non linear, tangential     Mood: expansive Affect: congruent,  Thought Process: Nonlinear, non goal directed, racing thoughts and flight of ideas with fixation  Descriptions of Associations: Not intact Thought Content: Disorganized, bizarre, paranoid thoughts Hallucinations: Reports angry voices of cousins, visual hallucinations of over 64 cousins in the rooms and aunts, appears to be responding to stimuli  Delusions: Paranoid thoughts of cameras trying to kill her, possible delusions  Suicidal Thoughts: denies SI, intention, plan  Homicidal Thoughts: denies HI, intention, plan    Alertness/Orientation: alert and oriented x3 to name, date, place  Insight: limited  Judgment: limited   Memory: poor   Community education officer  Concentration: poor Attention Span: poor Recall: poor Fund of Knowledge: poor    Cabin crew  Skills; Desire for Improvement; Resilience; Leisure Time    Physical Exam: Constitutional:      Appearance: Normal appearance.  Cardiovascular:     Rate and Rhythm: Normal rate.  Pulmonary:     Effort: Pulmonary effort is normal.  Neurological:     General: No focal deficit present.     Mental Status: Alert and oriented to person, place, and time.    Review of Systems  Constitutional: Negative.   Negative for chills, fever and weight loss.  HENT: Negative.    Eyes: Negative.   Respiratory: Negative.    Cardiovascular: Negative.   Gastrointestinal:  Negative for constipation, diarrhea, nausea and vomiting.  Genitourinary: Negative.   Musculoskeletal: Negative.   Skin: Negative.   Neurological: Negative.  Negative for tingling.    ASSESSMENT:  Diagnoses / Active Problems: Principal Problem: Brief psychotic disorder ([place]) Diagnosis: Principal Problem:   Brief psychotic disorder ([place]) Active Problems:   Suicidal ideation   Speech impediment   Generalized anxiety disorder   Major depressive disorder, recurrent episode ([place])   PLAN: Safety and Monitoring:  -- Involuntary admission to inpatient psychiatric unit for safety, stabilization and treatment  -- Daily contact with patient to assess and evaluate symptoms and progress in treatment  -- Patient's case to be discussed in multi-disciplinary team meeting  -- Observation Level : q15 minute checks  -- Vital signs:  q12 hours  -- Precautions: suicide, elopement, and assault  2. Medications:    Psychiatric Diagnosis and treatment  Brief Psychotic Disorder of unknown origin - Titrate Risperdal to 2 mg q12H (from 1 mg q12H)  - Continue Ativan 1 mg q12H for psychosis agitation - Ordered first psychotic break labs   Generalized Anxiety Disorder - Continue propranolol 10 mg BID - Continue Ativan to 2 mg q6H PRN if unresponsive to Atarax - Atarax 25 mg TID oral as needed for anxiety   Major Depressive Disorder - Lexapro discontinued due to concern for possible bipolar disorder   Agitation Protocol Benadryl 50 mg TID IV and oral as needed for agitation Haldol 5 mg TID IV and oral as needed for agitation Ativan 2 mg TID IM and oral as needed for agitation   Other as needed medications  Trazodone 50 mg at bedtime as needed for insomnia   Patient does not need nicotine replacement   Other as needed medications   Tylenol 650 mg every 6 hours as needed for pain Mylanta 30 mL every 4 hours as needed for indigestion Milk of magnesia 30 mL daily as needed for constipation     The risks/benefits/side-effects/alternatives to the above medication were discussed in detail with the patient and time was given for questions. The patient consents to medication trial. FDA black box warnings, if present, were discussed.   The patient is agreeable with the medication plan, as above. We will monitor the patient's response to pharmacologic treatment, and adjust medications as necessary.   3. Routine and other pertinent labs: EKG monitoring: QTc: 444   Metabolism / endocrine: BMI: Body mass index is 26.61 kg/m.   Pending: N-methyl-D-Aspartate  Heavy metals ANA w/Reflex if Positive HIV-1 RNA  Ceruloplasmin Vitamin B1  Results: Repeat UA: Normal A1c: Normal Vitamin B12: Wnl Folate: Wnl RPR: Normal Lipid Panel WNL CBC: Normal CMP: Normal TSH: Normal UDS: Positive for benzodiazepines (reported given by mother for panic attack last week)    5. Group and Therapy: -- Encouraged patient to participate in unit milieu and in scheduled group therapies     --  Short Term Goals: Ability to identify changes in lifestyle to reduce recurrence of condition will improve, Ability to disclose and discuss suicidal ideas, Ability to identify and develop effective coping behaviors will improve, Ability to maintain clinical measurements within normal limits will improve, and Compliance with prescribed medications will improve  -- Long Term Goals: Improvement in symptoms so as ready for discharge  6. Discharge Planning:   -- Social work and case management to assist with discharge planning and identification of hospital follow-up needs prior to discharge  -- Estimated LOS: 5-7 days  -- Discharge Concerns: Need to establish a safety plan; Medication compliance and effectiveness  -- Discharge Goals: Return home with  outpatient referrals for mental health follow-up including medication management/psychotherapy      Total Time Spent in Direct Patient Care:  I personally spent 35 minutes on the unit in direct patient care. The direct patient care time included face-to-face time with the patient, reviewing the patient's chart, communicating with other professionals, and coordinating care. Greater than 50% of this time was spent in counseling or coordinating care with the patient regarding goals of hospitalization, psycho-education, and discharge planning needs.   Fleeta Emmer, Medical Student PGY-1 Psychiatry 01/03/2023, 7:46 AM

## 2023-01-03 NOTE — Plan of Care (Signed)
  Problem: Activity: Goal: Will verbalize the importance of balancing activity with adequate rest periods Outcome: Progressing   Problem: Education: Goal: Knowledge of the prescribed therapeutic regimen will improve Outcome: Progressing   Problem: Coping: Goal: Coping ability will improve Outcome: Progressing  Patient is visible in Milieu interacting well with Peers and Staff denies SI/HI/V/H endorsing AH, and verbally contracted for safety. Support and encouragement provided.

## 2023-01-03 NOTE — Progress Notes (Signed)
01/03/23 0543  15 Minute Checks  Location Bedroom  Visual Appearance Calm  Behavior Composed  Sleep (Behavioral Health Patients Only)  Calculate sleep? (Click Yes once per 24 hr at 0600 safety check) Yes  Documented sleep last 24 hours 5.5

## 2023-01-03 NOTE — Progress Notes (Addendum)
[facility] MD Progress Note  01/03/2023 9:04 AM Rebecca Hoover  MRN:  [id number]   Reason for Admission:  Rebecca Hoover is a 22 y.o. female with a history of  Major depressive disorder, recurrent episode ([place]), GAD, speech impediment, SI , who was initially admitted for inpatient psychiatric hospitalization on 01/01/2023 for management of brief psychotic disorder. The patient is currently on Hospital Day 2.   Chart Review from last 24 hours:  The patient's chart was reviewed and nursing notes were reviewed. The patient's case was discussed in multidisciplinary team meeting. Per [facility], patient was taking medications appropriately. Per nursing, patient is calm and cooperative and attended 3 group sessions. Per nursing note, "Attended scheduled orientation group but required multiple redirections due to intrusiveness, being disorganized and actively responding to internal stimuli; talking to self. Continues to interact with "Tommy" who's not there while pacing in hall. Affect is animated, she's hyper-verbal, fidgety /restless with soft but tangential speech. However, she's able to follow redirections with easy commands and reinforcement.". The following as needed medications were given: atarax 2x, trazodone    Information Obtained Today During Patient Interview: Patient was seen at bedside this morning. On assessment today, patient notes her mood is "anxious". She denies issues with sleep and appetite. Pt continues to make nonsensical statements and reports auditory and visual hallucinations. She reports hearing screaming in his head and female voices telling her to "calm down". When asked how many people are in the room, she states a name. When asked to repeat her response, she says there are "537 people" in the room. She is slightly more redirectable and at times answers appropriately. She was A&O x 2 this morning, knowing her name and the date. She thought she is in the school hospital and when asked why she is in  the behavioral hospital, she said she fought with her step mother. She also notes her mother visiting her twice during her hospital stay and she felt that the visits went well.  Spoke with patient's mother Rebecca Hoover [phone]): Discussed updates regarding patient's behavior on the unit.  She reports that she visited the patient twice during her stay.  She reports that the first night, patient was still very distractible and walking around, difficult for her to sit still.  She feels that last night was much better, reporting patient was less distractible and more calm.  She feels like the mumbling has also improved but that she started difficulty understanding the patient.  She feels that the trigger for her worsening symptoms prior to admission was starting school after being on winter break.  She spoke with the patient's teachers and they said that after seeing the patient for a few days at school, they were worried that she may not make it because of how stressed she was regarding of the school demands.  She also reports the patient having a similar episode the beginning of her senior year of high school although not as severe.  She reports that the patient was in assisted learning classes all throughout elementary, middle, and high school.  She has a high suspicion that patient has a learning disability but when she tried to get the patient evaluated, people would tell her that her symptoms did not constitute for learning disability.  Principal Problem: Brief psychotic disorder ([name]) Diagnosis: Principal Problem:   Brief psychotic disorder ([name]) Active Problems:   Suicidal ideation   Speech impediment   Generalized anxiety disorder   Major depressive disorder, recurrent episode ([name])  Past Psychiatric History: MDD  Past Medical History: No past medical history on file. No past surgical history on file. Family History: No family history on file. Family Psychiatric  History: Bipolar  Disorder Social History:    Current Medications: Current Facility-Administered Medications  Medication Dose Route Frequency Provider Last Rate Last Admin   acetaminophen (TYLENOL) tablet 650 mg  650 mg Oral Q6H PRN Evette Georges, NP       alum & mag hydroxide-simeth (MAALOX/MYLANTA) 200-200-20 MG/5ML suspension 30 mL  30 mL Oral Q4H PRN Evette Georges, NP       haloperidol (HALDOL) tablet 5 mg  5 mg Oral TID PRN Janine Limbo, MD       And   LORazepam (ATIVAN) tablet 2 mg  2 mg Oral TID PRN Janine Limbo, MD       And   diphenhydrAMINE (BENADRYL) capsule 50 mg  50 mg Oral TID PRN Massengill, Ovid Curd, MD       haloperidol lactate (HALDOL) injection 5 mg  5 mg Intramuscular TID PRN Massengill, Ovid Curd, MD       And   LORazepam (ATIVAN) injection 2 mg  2 mg Intramuscular TID PRN Massengill, Ovid Curd, MD       And   diphenhydrAMINE (BENADRYL) injection 50 mg  50 mg Intramuscular TID PRN Massengill, Ovid Curd, MD       feeding supplement (ENSURE ENLIVE / ENSURE PLUS) liquid 237 mL  237 mL Oral BID BM Nkwenti, Doris, NP       hydrOXYzine (ATARAX) tablet 25 mg  25 mg Oral TID PRN Janine Limbo, MD   25 mg at 01/02/23 2355   LORazepam (ATIVAN) tablet 1 mg  1 mg Oral Q12H Massengill, Ovid Curd, MD   1 mg at 01/03/23 0804   LORazepam (ATIVAN) tablet 2 mg  2 mg Oral Q6H PRN Massengill, Ovid Curd, MD       magnesium hydroxide (MILK OF MAGNESIA) suspension 30 mL  30 mL Oral Daily PRN Evette Georges, NP       norethindrone-ethinyl estradiol (LOESTRIN) 1-20 MG-MCG tablet 1 tablet  1 tablet Oral Daily Evette Georges, NP       propranolol (INDERAL) tablet 10 mg  10 mg Oral Q12H Massengill, Ovid Curd, MD   10 mg at 01/03/23 0804   risperiDONE (RISPERDAL M-TABS) disintegrating tablet 2 mg  2 mg Oral Q12H Massengill, Ovid Curd, MD   2 mg at 01/03/23 0804   traZODone (DESYREL) tablet 50 mg  50 mg Oral QHS PRN Janine Limbo, MD   50 mg at 01/02/23 2050    Lab Results:  Results for orders placed or performed  during the hospital encounter of 01/01/23 (from the past 48 hour(s))  Urinalysis, Routine w reflex microscopic Urine, Clean Catch     Status: Abnormal   Collection Time: 01/01/23 12:55 PM  Result Value Ref Range   Color, Urine STRAW (A) YELLOW   APPearance CLEAR CLEAR   Specific Gravity, Urine 1.004 (L) 1.005 - 1.030   pH 7.0 5.0 - 8.0   Glucose, UA NEGATIVE NEGATIVE mg/dL   Hgb urine dipstick NEGATIVE NEGATIVE   Bilirubin Urine NEGATIVE NEGATIVE   Ketones, ur NEGATIVE NEGATIVE mg/dL   Protein, ur NEGATIVE NEGATIVE mg/dL   Nitrite NEGATIVE NEGATIVE   Leukocytes,Ua TRACE (A) NEGATIVE   RBC / HPF 0-5 0 - 5 RBC/hpf   WBC, UA 0-5 0 - 5 WBC/hpf   Bacteria, UA NONE SEEN NONE SEEN   Squamous Epithelial / HPF 0-5 0 - 5 /HPF  Comment: Performed at [facility], [place] [address], [place], [place] [address]  Vitamin B12     Status: None   Collection Time: 01/02/23  7:13 AM  Result Value Ref Range   Vitamin B-12 740 180 - 914 pg/mL    Comment: (NOTE) This assay is not validated for testing neonatal or myeloproliferative syndrome specimens for Vitamin B12 levels. Performed at [facility], [place] [address] [place], [place] [address]   Folate     Status: None   Collection Time: 01/02/23  7:13 AM  Result Value Ref Range   Folate 7.4 >5.9 ng/mL    Comment: Performed at [facility], [place] [address], [place], [place] [address]  RPR     Status: None   Collection Time: 01/02/23  7:13 AM  Result Value Ref Range   RPR Ser Ql NON REACTIVE NON REACTIVE    Comment: Performed at [facility] [address], [place], [place] [address]  Hemoglobin A1c     Status: None   Collection Time: 01/02/23  7:13 AM  Result Value Ref Range   Hgb A1c MFr Bld 4.8 4.8 - 5.6 %    Comment: (NOTE) Pre diabetes:          5.7%-6.4%  Diabetes:              >6.4%  Glycemic control for   <7.0% adults with diabetes    Mean Plasma Glucose 91.06 mg/dL     Comment: Performed at [facility] [address], [place], [place] [address]  Lipid panel     Status: None   Collection Time: 01/02/23  7:13 AM  Result Value Ref Range   Cholesterol 128 0 - 200 mg/dL   Triglycerides 34 <150 mg/dL   HDL 54 >40 mg/dL   Total CHOL/HDL Ratio 2.4 RATIO   VLDL 7 0 - 40 mg/dL   LDL Cholesterol 67 0 - 99 mg/dL    Comment:        Total Cholesterol/HDL:CHD Risk Coronary Heart Disease Risk Table                     Men   Women  1/2 Average Risk   3.4   3.3  Average Risk       5.0   4.4  2 X Average Risk   9.6   7.1  3 X Average Risk  23.4   11.0        Use the calculated Patient Ratio above and the CHD Risk Table to determine the patient's CHD Risk.        ATP III CLASSIFICATION (LDL):  <100     mg/dL   Optimal  100-129  mg/dL   Near or Above                    Optimal  130-159  mg/dL   Borderline  160-189  mg/dL   High  >190     mg/dL   Very High Performed at [facility] [address], [place], [place] [address]     Blood Alcohol level:  Lab Results  Component Value Date   ETH <10 22/48/2500    Metabolic Disorder Labs: Lab Results  Component Value Date   HGBA1C 4.8 01/02/2023   MPG 91.06 01/02/2023   MPG 88.19 12/29/2022   No results found for: "PROLACTIN" Lab Results  Component Value Date   CHOL  128 01/02/2023   TRIG 34 01/02/2023   HDL 54 01/02/2023   CHOLHDL 2.4 01/02/2023   VLDL 7 01/02/2023   LDLCALC 67 01/02/2023   LDLCALC 88 12/29/2022    Physical Findings: AIMS: Facial and Oral Movements Muscles of Facial Expression: None, normal Lips and Perioral Area: None, normal Jaw: None, normal Tongue: None, normal,Extremity Movements Upper (arms, wrists, hands, fingers): None, normal Lower (legs, knees, ankles, toes): None, normal, Trunk Movements Neck, shoulders, hips: None, normal, Overall Severity Severity of abnormal movements (highest score from questions above): None, normal Incapacitation  due to abnormal movements: None, normal Patient's awareness of abnormal movements (rate only patient's report): No Awareness, Dental Status Current problems with teeth and/or dentures?: No Does patient usually wear dentures?: No  CIWA:    COWS:     Musculoskeletal: Strength & Muscle Tone: within normal limits Gait & Station: normal Patient leans: N/A  Psychiatric Specialty Exam:  General Appearance: appears at stated age, casually dressed and slightly disheveled  Behavior: confused and somewhat cooperative  Psychomotor Activity: no psychomotor agitation or retardation noted   Eye Contact: fair  Speech: normal amount, tone, volume. Impaired fluency, at baseline she has a speech impediment    Mood: anxious Affect: congruent  Thought Process: tangential thought process noted Descriptions of Associations: loose  Thought Content Hallucinations: reports AH, VH , does not appear responding to stimuli during assessment Delusions: paranoia present Suicidal Thoughts: denies SI, intention, plan  Homicidal Thoughts: denies HI, intention, plan   Alertness/Orientation: A&O x 2, knows her name and the date. Did not know the place and her situation  Insight: poor Judgment: poor  Memory: limited   Executive Functions  Concentration: limited  Attention Span: limited  Recall: limited  Fund of Knowledge: limited      Animal nutritionist; Desire for Improvement; Resilience; Leisure Time  Physical Exam  Constitutional:      Appearance: Normal appearance.  Cardiovascular:     Rate and Rhythm: Normal rate.  Pulmonary:     Effort: Pulmonary effort is normal.  Neurological:     General: No focal deficit present.     Mental Status: Alert and oriented to person, place, and time.    Review of Systems  Constitutional: Negative.  Negative for chills, fever and weight loss.  HENT: Negative.    Eyes: Negative.   Respiratory: Negative.    Cardiovascular: Negative.    Gastrointestinal:  Negative for constipation, diarrhea, nausea and vomiting.  Genitourinary: Negative.   Musculoskeletal: Negative.   Skin: Negative.   Neurological: Negative.  Negative for tingling.      ASSESSMENT:  Diagnoses / Active Problems: Principal Problem: Brief psychotic disorder ([place]) Diagnosis: Principal Problem:   Brief psychotic disorder ([place]) Active Problems:   Suicidal ideation   Speech impediment   Generalized anxiety disorder   Major depressive disorder, recurrent episode ([place])   PLAN: Safety and Monitoring:             -- Involuntary admission to inpatient psychiatric unit for safety, stabilization and treatment             -- Daily contact with patient to assess and evaluate symptoms and progress in treatment             -- Patient's case to be discussed in multi-disciplinary team meeting             -- Observation Level : q15 minute checks             --  Vital signs:  q12 hours             -- Precautions: suicide, elopement, and assault   2. Medications:               Psychiatric Diagnosis and Treatment   Brief Psychotic Disorder of unknown origin - Increase Risperdal to 1 mg qAM, 1 mg qPM, and 2 mg qHS (increased from 1 mg q8H) - Decrease Ativan to 0.5 mg q12H due to oversedation during the day - Ordered first psychotic break labs   Generalized Anxiety Disorder - Continue propranolol 10 mg BID - Continue Ativan to 2 mg q6H PRN if unresponsive to Atarax - Atarax 25 mg TID oral as needed for anxiety  Major Depressive Disorder - Lexapro taper finished 1/17   Agitation Protocol Benadryl 50 mg TID IV and oral as needed for agitation Haldol 5 mg TID IV and oral as needed for agitation Ativan 2 mg TID IM and oral as needed for agitation   Other as needed medications  Trazodone 50 mg at bedtime as needed for insomnia   Patient does not need nicotine replacement   Medical Diagnosis and Treatment   Abnormal Urine Analysis - UA from the ED  positive for large leukocytes, rare bacteria. Will repeat due to concern for UTI   Other as needed medications  Tylenol 650 mg every 6 hours as needed for pain Mylanta 30 mL every 4 hours as needed for indigestion Milk of magnesia 30 mL daily as needed for constipation     The risks/benefits/side-effects/alternatives to the above medication were discussed in detail with the patient and time was given for questions. The patient consents to medication trial. FDA black box warnings, if present, were discussed.   The patient is agreeable with the medication plan, as above. We will monitor the patient's response to pharmacologic treatment, and adjust medications as necessary.   3. Routine and other pertinent labs: EKG monitoring: QTc: 444   Metabolism / endocrine: BMI: Body mass index is 26.61 kg/m.   Pending: N-methyl-D-Aspartate  Heavy metals ANA w/Reflex if Positive HIV-1 RNA  Ceruloplasmin Vitamin B1    Results: Repeat UA trace leukocytes, no bacteria Vitamin B12 WNL Folate WNL RPR WNL A1c 4.8 Lipid Panel WNL CBC: Normal CMP: Normal TSH: Normal UDS: Positive for benzodiazepines (reported given by mother for panic attack last week)   4. Group Therapy:             -- Encouraged patient to participate in unit milieu and in scheduled group therapies              -- Short Term Goals: Ability to identify changes in lifestyle to reduce recurrence of condition will improve, Ability to disclose and discuss suicidal ideas, Ability to demonstrate self-control will improve, and Compliance with prescribed medications will improve             -- Long Term Goals: Improvement in symptoms so as ready for discharge -- Patient is encouraged to participate in group therapy while admitted to the psychiatric unit. -- We will address other chronic and acute stressors, which contributed to the patient's Brief psychotic disorder ([place]) in order to reduce the risk of self-harm at discharge.   5.  Discharge Planning:              -- Social work and case management to assist with discharge planning and identification of hospital follow-up needs prior to discharge             --  Estimated LOS: 5-7 days             -- Discharge Concerns: Need to establish a safety plan; Medication compliance and effectiveness             -- Discharge Goals: Return home with outpatient referrals for mental health follow-up including medication management/psychotherapy       Total Time Spent in Direct Patient Care:  I personally spent 20 minutes on the unit in direct patient care. The direct patient care time included face-to-face time with the patient, reviewing the patient's chart, communicating with other professionals, and coordinating care. Greater than 50% of this time was spent in counseling or coordinating care with the patient regarding goals of hospitalization, psycho-education, and discharge planning needs.   Fleeta Emmer, Medical Student  Coralyn Pear PGY-1 Psychiatry 01/03/2023, 9:04 AM

## 2023-01-03 NOTE — Group Note (Signed)
Recreation Therapy Group Note   Group Topic:Coping Skills  Group Date: 01/03/2023 Start Time: 1000 End Time: 1030 Facilitators: Aloise Copus-McCall, LRT,CTRS Location: [address] Dayroom   Goal Area(s) Addresses: Patient will define what a coping skill is. Patient will create a list of healthy coping skills beginning with each letter of the alphabet. Patient will successfully identify positive coping skills they can use post d/c.  Patient will acknowledge benefit(s) of using learned coping skills post d/c.  Group Description: Coping A to Z. Patient asked to identify what a coping skill is and when they use them. Patients with Probation officer discussed healthy versus unhealthy coping skills. Next patients were given a blank worksheet titled "Coping Skills A-Z". Patients were instructed to come up with at least one positive coping skill per letter of the alphabet.  Patients were given 15 minutes to brainstorm, before ideas were presented to the large group. Patients and LRT debriefed on the importance of coping skill selection based on situation and back-up plans when a skill tried is not effective. At the end of group, patients were given an handout of alphabetized strategies to keep for future reference.   Affect/Mood: Drowsy   Participation Level: Minimal   Participation Quality: Independent   Behavior: Lethargic   Speech/Thought Process: Slurred   Insight: Impaired   Judgement: Impaired   Modes of Intervention: Worksheet   Patient Response to Interventions:  Challenging    Education Outcome:  Acknowledges education and In group clarification offered    Clinical Observations/Individualized Feedback: Pt was drowsy and had a hard time sitting still.  Pt was constantly moving around and dosed off at one point.  Pt then started crying at which point, LRT encouraged pt to go lay down and get some rest.    Plan: Continue to engage patient in RT group sessions 2-3x/week.   Rebecca Hoover, LRT,CTRS 01/03/2023 11:08 AM

## 2023-01-03 NOTE — Progress Notes (Signed)
  Administered PRN Hydroxyzine and Trazodone per [facility] per patient request.

## 2023-01-04 DIAGNOSIS — F23 Brief psychotic disorder: Secondary | ICD-10-CM | POA: Diagnosis not present

## 2023-01-04 DIAGNOSIS — F29 Unspecified psychosis not due to a substance or known physiological condition: Secondary | ICD-10-CM | POA: Diagnosis not present

## 2023-01-04 DIAGNOSIS — R45851 Suicidal ideations: Secondary | ICD-10-CM | POA: Diagnosis not present

## 2023-01-04 LAB — N-METHYL-D-ASPARTATE RECPT.IGG: N-methyl-D-Aspartate Recpt.IgG: NEGATIVE

## 2023-01-04 LAB — RESP PANEL BY RT-PCR (RSV, FLU A&B, COVID)  RVPGX2
Influenza A by PCR: NEGATIVE
Influenza B by PCR: NEGATIVE
Resp Syncytial Virus by PCR: NEGATIVE
SARS Coronavirus 2 by RT PCR: NEGATIVE

## 2023-01-04 MED ORDER — LITHIUM CARBONATE ER 300 MG PO TBCR
300.0000 mg | EXTENDED_RELEASE_TABLET | Freq: Two times a day (BID) | ORAL | Status: DC
  Administered 2023-01-04 – 2023-01-18 (×28): 300 mg via ORAL
  Filled 2023-01-04 (×39): qty 1

## 2023-01-04 MED ORDER — LITHIUM CARBONATE 300 MG PO CAPS
300.0000 mg | ORAL_CAPSULE | Freq: Two times a day (BID) | ORAL | Status: DC
  Administered 2023-01-04: 300 mg via ORAL
  Filled 2023-01-04 (×6): qty 1

## 2023-01-04 MED ORDER — PROPRANOLOL HCL 20 MG PO TABS
20.0000 mg | ORAL_TABLET | Freq: Three times a day (TID) | ORAL | Status: DC
  Administered 2023-01-04 – 2023-01-07 (×9): 20 mg via ORAL
  Filled 2023-01-04 (×15): qty 1

## 2023-01-04 MED ORDER — LORAZEPAM 0.5 MG PO TABS
0.2500 mg | ORAL_TABLET | Freq: Three times a day (TID) | ORAL | Status: DC
  Administered 2023-01-04 – 2023-01-06 (×6): 0.25 mg via ORAL
  Filled 2023-01-04 (×6): qty 1

## 2023-01-04 NOTE — Group Note (Signed)
Recreation Therapy Group Note   Group Topic:Health and Wellness  Group Date: 01/04/2023 Start Time: 1013 End Time: 1040 Facilitators: Beatryce Colombo-McCall, LRT,CTRS Location: [address] Dayroom   Goal Area(s) Addresses:  Patient will define components of whole wellness. Patient will verbalize benefit of whole wellness.  Group Description: Code Breakers.  LRT and patients talked about the importance of mental, physical and spiritual health.  LRT and patients also discussed the importance of exercising your mental health as it physical and spiritual health.  Patients were then given a worksheet with a code at the top.  The code was numbered 1-26 with each letter of the alphabet associated with each number.  Patients were to the code to decode what each word was on the sheet.   Affect/Mood: Happy   Participation Level: Active   Participation Quality: Independent   Behavior: On-task   Speech/Thought Process: Delusional, Slurred   Insight: Lacking   Judgement: Lacking    Modes of Intervention: Worksheet   Patient Response to Interventions:  Attentive   Education Outcome:  Acknowledges education and In group clarification offered    Clinical Observations/Individualized Feedback: Pt participated in the activity and was able to decode the words on the page.  Pt did, however, struggle during the discussion.  Pt expressed if the mental, physical and spiritual aspects of a person are not in line with each other, "it will cause you to fight".  Pt speech remains slurred and hard to understand.    Plan: Continue to engage patient in RT group sessions 2-3x/week.   Sachit Gilman-McCall, LRT,CTRS  01/04/2023 12:31 PM

## 2023-01-04 NOTE — Group Note (Unsigned)
Occupational Therapy Group Note  Group Topic:Other  Group Date: 01/03/2023 Start Time: 1400 End Time: 1102 Facilitators: Brantley Stage, OT        Participation Level: {OT BHH Participation TRZNB:56701}   Participation Quality: {OT BHH Participation Quality:26268}   Behavior: {BHH OT Group Behavior:26269}   Speech/Thought Process: {BHH OT Speech/Thought Process:26270}   Affect/Mood: {OT BHH Affect/Mood:26271}   Insight: {OT BHH Insight:26272}   Judgement: {OT BHH Judgement:26272}   Individualization: *** was *** in their participation of group discussion/activity. *** identified  Modes of Intervention: {BHH MODES OF INTERVENTION:26273}  Patient Response to Interventions:  {BHH OT Patient Response to Interventions:26274}   Plan: Continue to engage patient in OT groups 2 - 3x/week.  01/04/2023  Brantley Stage, OT

## 2023-01-04 NOTE — Progress Notes (Signed)
Pt denies SI, HI, AVH and pain when assessed. Presents with fixed smiles and is restless / fidgety on interactions. Continues to need frequent verbal redirections to comply with unit routines and has been cooperative with care thus far. Compliant with current treatment regimen. Showered with staff assistance, changed clothes.  Safety checks maintained at Q 15 minutes intervals. Support and reassurance provided to pt. Encouraged to voice concerns. Tolerates meals, medications and fluids well. Safety maintained in milieu.

## 2023-01-04 NOTE — Group Note (Signed)
LCSW Group Therapy Note  Group Date: 01/04/2023 Start Time: 1100 End Time: 1200   Type of Therapy and Topic:  Group Therapy: Anger Cues and Responses  Participation Level:  Minimal   Description of Group:   In this group, patients learned how to recognize the physical, cognitive, emotional, and behavioral responses they have to anger-provoking situations.  They identified a recent time they became angry and how they reacted.  They analyzed how their reaction was possibly beneficial and how it was possibly unhelpful.  The group discussed a variety of healthier coping skills that could help with such a situation in the future.  Focus was placed on how helpful it is to recognize the underlying emotions to our anger, because working on those can lead to a more permanent solution as well as our ability to focus on the important rather than the urgent.  Therapeutic Goals: Patients will remember their last incident of anger and how they felt emotionally and physically, what their thoughts were at the time, and how they behaved. Patients will identify how their behavior at that time worked for them, as well as how it worked against them. Patients will explore possible new behaviors to use in future anger situations. Patients will learn that anger itself is normal and cannot be eliminated, and that healthier reactions can assist with resolving conflict rather than worsening situations.  Summary of Patient Progress:  Patient was active during the group. Patient had slow speech while speaking and then would start laughing with her peer. Patient was able to share her thermometer and the colors she chose. Patient shared what her feelings were when she gets anger and the behavior she has. Into group, patient stopped sharing and stared off into space. Patient was in and out of group during session.  Therapeutic Modalities:   Cognitive Behavioral Therapy    Charlett Lango 01/04/2023  12:19 PM

## 2023-01-04 NOTE — Progress Notes (Signed)
   01/04/23 2046  Psych Admission Type (Psych Patients Only)  Admission Status Voluntary  Psychosocial Assessment  Patient Complaints Anxiety  Eye Contact Fair  Facial Expression Anxious  Affect Anxious;Preoccupied  Speech Tangential  Interaction Cautious;Childlike  Motor Activity Fidgety  Appearance/Hygiene Improved  Behavior Characteristics Cooperative;Appropriate to situation;Anxious  Mood Anxious;Pleasant  Thought Process  Coherency Disorganized  Content Preoccupation  Delusions None reported or observed  Perception Hallucinations  Hallucination Visual  Judgment Poor  Confusion None  Danger to Self  Current suicidal ideation? Denies

## 2023-01-04 NOTE — Progress Notes (Signed)
Adult Psychoeducational Group Note  Date:  01/04/2023 Time:  9:03 PM  Group Topic/Focus:  Wrap-Up Group:   The focus of this group is to help patients review their daily goal of treatment and discuss progress on daily workbooks.  Participation Level:  Active  Participation Quality:  Drowsy  Affect:  Appropriate  Cognitive:  Confused  Insight: Limited  Engagement in Group:  Improving  Modes of Intervention:  Discussion  Additional Comments:  Pt stated her goal for today was to focus on her treatment plan. Pt stated she accomplished her goal today. Pt stated she talked doctor and with her social worker about her care today. Pt rated her overall day a 9 out of 10. Pt stated she was able to contact her uncle today which improved her overall day. Pt stated her mother coming for visitation tonight improved her night. Pt stated she felt better about herself tonight. Pt stated staff brought back all her meals today because she is on unit restriction. Pt stated she took all medications provided today. Pt stated her appetite was pretty good today. Pt rated her sleep last night was pretty good. Pt stated the goal tonight was to get some rest. Pt stated she had no physical pain tonight. Pt deny visual hallucinations and auditory issues tonight. Pt denies thoughts of harming herself or others. Pt stated she would alert staff if anything changed.  Candy Sledge 01/04/2023, 9:03 PM

## 2023-01-04 NOTE — [facility] Group Notes (Signed)
Spirituality Group facilitated by Kathrynn Humble, [facility]  Group focused on topic of strength. Group members reflected on what thoughts and feelings emerge when they hear this topic. They then engaged in facilitated dialog around how strength is present in their lives. This dialog focused on representing what strength had been to them in their lives (images and patterns given) and what they saw as helpful in their life now (what they needed / wanted).  Activity drew on narrative framework   Patient Progress:  Rebecca Hoover attended group and participated in group conversation.  She spoke very softly and did not enunciate and it was difficult to hear her.  Her comments were at times off topic, but she was engaged for the entirety of the group.  Chaplain Janne Napoleon, [place] PAger, [phone]

## 2023-01-04 NOTE — Progress Notes (Signed)
Recreation Therapy Notes  INPATIENT RECREATION THERAPY ASSESSMENT  Patient Details Name: Rebecca Hoover MRN: [id number] DOB: 19-Dec-2000 Today's Date: 01/04/2023       Information Obtained From: Patient  Able to Participate in Assessment/Interview: Yes (Pt was able to participate in spurts.)  Patient Presentation: Responsive (Pt speech was slurred and hard to understand some of what she says.)  Reason for Admission (Per Patient): Other (Comments) (Pt stated "too much boys".)  Patient Stressors: Other (Comment) Rebecca Hoover")  Coping Skills:   Journal, TV, Music, Meditate, Exercise, Talk, Art, Prayer  Leisure Interests (2+):  Art - Draw, Art - Coloring, Games - Other (Comment) (Hide and seek; dodge ball)  Frequency of Recreation/Participation: Other (Comment) (Every now and then)  Awareness of Community Resources:  Yes  Community Resources:  Park, [place], Engineer, drilling  Current Use: Yes  If no, Barriers?:    Expressed Interest in [place]: No  [employer] of Residence:  Investment banker, corporate  Patient Main Form of Transportation: Musician  Patient Strengths:  Warehouse manager, Driving trucks"  Patient Identified Areas of Improvement:  "Trucks"  Patient Goal for Hospitalization:  "get out of the system"  Current SI (including self-harm):  No  Current HI:  No  Current AVH: No  Staff Intervention Plan: Group Attendance, Collaborate with Interdisciplinary Treatment Team  Consent to Intern Participation: N/A   Jeshua Ransford-McCall, LRT,CTRS Abby Tucholski A Jenesa Foresta-McCall 01/04/2023, 12:56 PM

## 2023-01-04 NOTE — Progress Notes (Addendum)
[facility] MD Progress Note  01/04/2023 8:19 AM Rebecca Hoover  MRN:  [id number]   Reason for Admission:  Rebecca Hoover is a 22 y.o. female with a history of  Major depressive disorder, recurrent episode ([place]), GAD, speech impediment, SI , who was initially admitted for inpatient psychiatric hospitalization on 01/01/2023 for management of brief psychotic disorder. The patient is currently on Hospital Day 3.   Chart Review from last 24 hours:  The patient's chart was reviewed and nursing notes were reviewed. The patient's case was discussed in multidisciplinary team meeting. Per [facility], patient was taking medications appropriately. Per nursing, patient was very drowsy during the day yesterday and missed her mother's visitation in the afternoon as she was sleeping. Then she was very active at around 2-3 am, running through her door into the hallway and was given prn Haldol and Benadryl which helped her fall back asleep. Attended 2 groups. CSW was completed with collateral from mother. Patient received the following PRN medications: Benadryl, Atarax, Haldol and Trazodone.  Information Obtained Today During Patient Interview: The patient was seen and evaluated on the unit. On assessment today the patient is slurring her speech significantly more, but appears less drowsy compared to yesterday. Has less pressured and tangential speech compared to admission. Reports not hearing any voices today, but did report visual hallucinations of "5 people" in the room when there were only 2 (compared to yesterday when she reported 557). Reports sleeping well overnight and good appetite. Was happy to talk to her mother and plans to do so again today. Asked when she will be able to leave the hospital. Denied AH, SI, HI this morning.    Principal Problem: Brief psychotic disorder ([place]) Diagnosis: Principal Problem:   Brief psychotic disorder ([place]) Active Problems:   Suicidal ideation   Speech impediment   Generalized anxiety  disorder   Major depressive disorder, recurrent episode ([place])    Past Psychiatric History: MDD  Past Medical History: No past medical history on file. No past surgical history on file. Family History: No family history on file. Family Psychiatric  History: Bipolar Disorder  Sleep:  Fair    Appetite:  Fair  Current Medications: Current Facility-Administered Medications  Medication Dose Route Frequency Provider Last Rate Last Admin   acetaminophen (TYLENOL) tablet 650 mg  650 mg Oral Q6H PRN Evette Georges, NP       alum & mag hydroxide-simeth (MAALOX/MYLANTA) 200-200-20 MG/5ML suspension 30 mL  30 mL Oral Q4H PRN Evette Georges, NP       haloperidol (HALDOL) tablet 5 mg  5 mg Oral TID PRN Janine Limbo, MD   5 mg at 01/04/23 0300   And   LORazepam (ATIVAN) tablet 2 mg  2 mg Oral TID PRN Janine Limbo, MD       And   diphenhydrAMINE (BENADRYL) capsule 50 mg  50 mg Oral TID PRN Janine Limbo, MD   50 mg at 01/04/23 0300   haloperidol lactate (HALDOL) injection 5 mg  5 mg Intramuscular TID PRN Janine Limbo, MD       And   LORazepam (ATIVAN) injection 2 mg  2 mg Intramuscular TID PRN Massengill, Ovid Curd, MD       And   diphenhydrAMINE (BENADRYL) injection 50 mg  50 mg Intramuscular TID PRN Massengill, Ovid Curd, MD       feeding supplement (ENSURE ENLIVE / ENSURE PLUS) liquid 237 mL  237 mL Oral BID BM Nkwenti, Doris, NP   237 mL at 01/03/23 1304  hydrOXYzine (ATARAX) tablet 25 mg  25 mg Oral TID PRN Janine Limbo, MD   25 mg at 01/02/23 2355   LORazepam (ATIVAN) tablet 0.5 mg  0.5 mg Oral Q12H Coralyn Pear B, MD   0.5 mg at 01/03/23 2020   LORazepam (ATIVAN) tablet 1 mg  1 mg Oral Q6H PRN Massengill, Ovid Curd, MD       magnesium hydroxide (MILK OF MAGNESIA) suspension 30 mL  30 mL Oral Daily PRN Evette Georges, NP       norethindrone-ethinyl estradiol (LOESTRIN) 1-20 MG-MCG tablet 1 tablet  1 tablet Oral Daily Evette Georges, NP       propranolol (INDERAL) tablet 10  mg  10 mg Oral Q12H Massengill, Ovid Curd, MD   10 mg at 01/03/23 2019   risperiDONE (RISPERDAL M-TABS) disintegrating tablet 1 mg  1 mg Oral BID Coralyn Pear B, MD   1 mg at 01/04/23 7902   risperiDONE (RISPERDAL M-TABS) disintegrating tablet 2 mg  2 mg Oral QHS Massengill, Nathan, MD       traZODone (DESYREL) tablet 50 mg  50 mg Oral QHS PRN Janine Limbo, MD   50 mg at 01/02/23 2050    Lab Results: No results found for this or any previous visit (from the past 26 hour(s)).  Blood Alcohol level:  Lab Results  Component Value Date   ETH <10 40/97/3532    Metabolic Disorder Labs: Lab Results  Component Value Date   HGBA1C 4.8 01/02/2023   MPG 91.06 01/02/2023   MPG 88.19 12/29/2022   No results found for: "PROLACTIN" Lab Results  Component Value Date   CHOL 128 01/02/2023   TRIG 34 01/02/2023   HDL 54 01/02/2023   CHOLHDL 2.4 01/02/2023   VLDL 7 01/02/2023   LDLCALC 67 01/02/2023   LDLCALC 88 12/29/2022    Physical Findings: AIMS: Facial and Oral Movements Muscles of Facial Expression: None, normal Lips and Perioral Area: None, normal Jaw: None, normal Tongue: None, normal,Extremity Movements Upper (arms, wrists, hands, fingers): None, normal Lower (legs, knees, ankles, toes): None, normal, Trunk Movements Neck, shoulders, hips: None, normal, Overall Severity Severity of abnormal movements (highest score from questions above): None, normal Incapacitation due to abnormal movements: None, normal Patient's awareness of abnormal movements (rate only patient's report): No Awareness, Dental Status Current problems with teeth and/or dentures?: No Does patient usually wear dentures?: No    Musculoskeletal: Strength & Muscle Tone: within normal limits Gait & Station: normal Patient leans: N/A   Psychiatric Specialty Exam:   General Appearance: appears at stated age, casually dressed and slightly disheveled   Behavior: confused and somewhat cooperative    Psychomotor Activity: no psychomotor agitation or retardation noted    Eye Contact: fair  Speech: normal amount, tone, volume. Impaired fluency, at baseline she has a speech impediment      Mood: anxious Affect: congruent   Thought Process: tangential thought process noted Descriptions of Associations: loose   Thought Content Hallucinations: reports visual hallucinations but no AH,  does not appear responding to stimuli during assessment Delusions: paranoia present Suicidal Thoughts: denies SI, intention, plan  Homicidal Thoughts: denies HI, intention, plan    Alertness/Orientation: A&O x 3,  knew her name, date, and place Insight: poor Judgment: poor   Memory: limited    Executive Functions  Concentration: limited  Attention Span: limited  Recall: limited  Fund of Knowledge: limited     Animal nutritionist; Desire for Improvement; Resilience; Leisure Time    Physical Exam:  Constitutional:      Appearance: Normal appearance.  Cardiovascular:     Rate and Rhythm: Normal rate.  Pulmonary:     Effort: Pulmonary effort is normal.  Neurological:     General: No focal deficit present.     Mental Status: Alert and oriented to person, place, and time.    Review of Systems  Constitutional: Negative.  Negative for chills, fever and weight loss.  HENT: Negative.    Eyes: Negative.   Respiratory: Negative.    Cardiovascular: Negative.   Gastrointestinal:  Negative for constipation, diarrhea, nausea and vomiting.  Genitourinary: Negative.   Musculoskeletal: Negative.   Skin: Negative.   Neurological: Negative.  Negative for tingling.    ASSESSMENT:  Diagnoses / Active Problems: Principal Problem: Brief psychotic disorder ([place]) Diagnosis: Principal Problem:   Brief psychotic disorder ([place]) Active Problems:   Suicidal ideation   Speech impediment   Generalized anxiety disorder   Major depressive disorder, recurrent episode ([place])   PLAN: Safety  and Monitoring:  -- Involuntary admission to inpatient psychiatric unit for safety, stabilization and treatment  -- Daily contact with patient to assess and evaluate symptoms and progress in treatment  -- Patient's case to be discussed in multi-disciplinary team meeting  -- Observation Level : q15 minute checks  -- Vital signs:  q12 hours  -- Precautions: suicide, elopement, and assault  2. Medications:    Psychiatric Diagnosis and treatment  Brief Psychotic Disorder of unknown origin - Increase Risperdal to 1 mg qAM, 1 mg qPM, 2 mg qHS  (increased from 1 mg q8H) - Decrease Ativan to 0.25 mg q8H due to oversedation during the day - Start Lithobid 300 mg q12 with meals for mood stabilization (possible manic features)  - Ordered first psychotic break labs   Generalized Anxiety Disorder - Increase propranolol 20 mg q8H due to tachycardia - Continue Ativan to 2 mg q6H PRN if unresponsive to Atarax - Atarax 25 mg TID oral as needed for anxiety   Major Depressive Disorder - Lexapro taper finished 1/17   Agitation Protocol Benadryl 50 mg TID IV and oral as needed for agitation Haldol 5 mg TID IV and oral as needed for agitation Ativan 2 mg TID IM and oral as needed for agitation   Other as needed medications  Trazodone 50 mg at bedtime as needed for insomnia   Patient does not need nicotine replacement   Medical Diagnosis and Treatment   Possible URI - Respiratory panel negative  Abnormal Urine Analysis - UA from the ED positive for large leukocytes, rare bacteria. Will repeat due to concern for UTI   Other as needed medications  Tylenol 650 mg every 6 hours as needed for pain Mylanta 30 mL every 4 hours as needed for indigestion Milk of magnesia 30 mL daily as needed for constipation     The risks/benefits/side-effects/alternatives to the above medication were discussed in detail with the patient and time was given for questions. The patient consents to medication trial.  FDA black box warnings, if present, were discussed.   The patient is agreeable with the medication plan, as above. We will monitor the patient's response to pharmacologic treatment, and adjust medications as necessary.   3. Routine and other pertinent labs: EKG monitoring: QTc: 444   Metabolism / endocrine: BMI: Body mass index is 26.61 kg/m.   Pending: Heavy metals Vitamin B1    Results: Ceruloplasmin: Normal ANA w/Reflex if Positive: Normal HIV-1 RNA: Normal N-methyl-D-Aspartate : Normal Repeat UA trace  leukocytes, no bacteria Vitamin B12 WNL Folate WNL RPR WNL A1c 4.8 Lipid Panel WNL CBC: Normal CMP: Normal TSH: Normal UDS: Positive for benzodiazepines (reported given by mother for panic attack last week)    5. Group and Therapy: -- Encouraged patient to participate in unit milieu and in scheduled group therapies     -- Short Term Goals: Ability to identify changes in lifestyle to reduce recurrence of condition will improve, Ability to verbalize feelings will improve, Ability to disclose and discuss suicidal ideas, and Ability to identify and develop effective coping behaviors will improve  -- Long Term Goals: Improvement in symptoms so as ready for discharge  6. Discharge Planning:   -- Social work and case management to assist with discharge planning and identification of hospital follow-up needs prior to discharge  -- Estimated LOS: 5-7 days  -- Discharge Concerns: Need to establish a safety plan; Medication compliance and effectiveness  -- Discharge Goals: Return home with outpatient referrals for mental health follow-up including medication management/psychotherapy      Total Time Spent in Direct Patient Care:  I personally spent 45 minutes on the unit in direct patient care. The direct patient care time included face-to-face time with the patient, reviewing the patient's chart, communicating with other professionals, and coordinating care. Greater than 50% of  this time was spent in counseling or coordinating care with the patient regarding goals of hospitalization, psycho-education, and discharge planning needs.   Fleeta Emmer (MS3)  Coralyn Pear PGY-1 Psychiatry 01/04/2023, 8:19 AM

## 2023-01-05 DIAGNOSIS — F23 Brief psychotic disorder: Secondary | ICD-10-CM | POA: Diagnosis not present

## 2023-01-05 LAB — HEAVY METALS, BLOOD
Arsenic: 2 ug/L (ref 0–9)
Lead: <1 ug/dL (ref 0.0–3.4)
Mercury: <1 ug/L (ref 0.0–14.9)

## 2023-01-05 NOTE — [facility] Group Notes (Signed)
[place] Group Notes:  (Nursing/MHT/Case Management/Adjunct)  Date:  01/05/2023  Time:  8:28 PM  Type of Therapy:  Group Therapy  Participation Level:  Active  Participation Quality:  Appropriate  Affect:  Appropriate  Cognitive:  Appropriate  Insight:  Appropriate  Engagement in Group:  Limited  Modes of Intervention:  Limit-setting  Summary of Progress/Problems:Try not to get sick, not to sleep so much, go home and get back in school.  Rebecca Hoover 01/05/2023, 8:28 PM

## 2023-01-05 NOTE — Progress Notes (Signed)
[facility] MD Progress Note  01/05/2023 7:02 AM Rebecca Hoover  MRN:  [id number]   Reason for Admission:  Rebecca Hoover is a 22 y.o. female with a history of  Major depressive disorder, recurrent episode ([place]), GAD, speech impediment, SI , who was initially admitted for inpatient psychiatric hospitalization on 01/01/2023 for management of brief psychotic disorder. The patient is currently on Hospital Day 4.   Chart Review from last 24 hours:  The patient's chart was reviewed and nursing notes were reviewed. The patient's case was discussed in multidisciplinary team meeting. Per [facility], patient was taking medications appropriately. Per nursing, patient is calm and cooperative and attended 4 group sessions. The following as needed medications were given: tylenol 1x   Information Obtained Today During Patient Interview: Patient seen at bedside this morning. She was sleepy but able to respond to questions, less euphoric compared to yesterday. She notes feeling "nasty" and is requesting deodorant. She is concerned of her hygiene and states she typically washes her hair twice. She washed her hair this morning. She denies SI and HI. Regarding AVH, she continues to be inconsistent with her answers, denying at times but then also says she hears "Andrew's" voice. She denies VH today, stating we were the only people in the room. This is an improvement since when she was admitted, she states many people were in the room. Patient's mother visited yesterday which went well.    Principal Problem: Brief psychotic disorder ([place]) Diagnosis: Principal Problem:   Brief psychotic disorder ([place]) Active Problems:   Suicidal ideation   Speech impediment   Generalized anxiety disorder   Major depressive disorder, recurrent episode ([place])    Past Psychiatric History: MDD  Past Medical History: No past medical history on file. No past surgical history on file. Family History: No family history on file. Family Psychiatric   History: Bipolar Disorder  Sleep:  Fair    Appetite:  Fair  Current Medications: Current Facility-Administered Medications  Medication Dose Route Frequency Provider Last Rate Last Admin   acetaminophen (TYLENOL) tablet 650 mg  650 mg Oral Q6H PRN Evette Georges, NP   650 mg at 01/05/23 0624   alum & mag hydroxide-simeth (MAALOX/MYLANTA) 200-200-20 MG/5ML suspension 30 mL  30 mL Oral Q4H PRN Evette Georges, NP       haloperidol (HALDOL) tablet 5 mg  5 mg Oral TID PRN Janine Limbo, MD   5 mg at 01/04/23 0300   And   LORazepam (ATIVAN) tablet 2 mg  2 mg Oral TID PRN Janine Limbo, MD       And   diphenhydrAMINE (BENADRYL) capsule 50 mg  50 mg Oral TID PRN Janine Limbo, MD   50 mg at 01/04/23 0300   haloperidol lactate (HALDOL) injection 5 mg  5 mg Intramuscular TID PRN Massengill, Ovid Curd, MD       And   LORazepam (ATIVAN) injection 2 mg  2 mg Intramuscular TID PRN Massengill, Ovid Curd, MD       And   diphenhydrAMINE (BENADRYL) injection 50 mg  50 mg Intramuscular TID PRN Massengill, Ovid Curd, MD       feeding supplement (ENSURE ENLIVE / ENSURE PLUS) liquid 237 mL  237 mL Oral BID BM Nkwenti, Doris, NP   237 mL at 01/04/23 1509   hydrOXYzine (ATARAX) tablet 25 mg  25 mg Oral TID PRN Janine Limbo, MD   25 mg at 01/02/23 2355   lithium carbonate (LITHOBID) ER tablet 300 mg  300 mg Oral Q12H Coralyn Pear  B, MD   300 mg at 01/04/23 2039   LORazepam (ATIVAN) tablet 0.25 mg  0.25 mg Oral Q8H , Orma Render B, MD   0.25 mg at 01/05/23 9030   LORazepam (ATIVAN) tablet 1 mg  1 mg Oral Q6H PRN Massengill, Ovid Curd, MD       magnesium hydroxide (MILK OF MAGNESIA) suspension 30 mL  30 mL Oral Daily PRN Evette Georges, NP       norethindrone-ethinyl estradiol (LOESTRIN) 1-20 MG-MCG tablet 1 tablet  1 tablet Oral Daily Evette Georges, NP       propranolol (INDERAL) tablet 20 mg  20 mg Oral Q8H Massengill, Ovid Curd, MD   20 mg at 01/05/23 0923   risperiDONE (RISPERDAL M-TABS) disintegrating  tablet 1 mg  1 mg Oral BID Coralyn Pear B, MD   1 mg at 01/05/23 3007   risperiDONE (RISPERDAL M-TABS) disintegrating tablet 2 mg  2 mg Oral QHS Massengill, Ovid Curd, MD   2 mg at 01/04/23 2039   traZODone (DESYREL) tablet 50 mg  50 mg Oral QHS PRN Janine Limbo, MD   50 mg at 01/02/23 2050    Lab Results:  Results for orders placed or performed during the hospital encounter of 01/01/23 (from the past 48 hour(s))  Resp panel by RT-PCR (RSV, Flu A&B, Covid) Anterior Nasal Swab     Status: None   Collection Time: 01/04/23  9:36 AM   Specimen: Anterior Nasal Swab  Result Value Ref Range   SARS Coronavirus 2 by RT PCR NEGATIVE NEGATIVE    Comment: (NOTE) SARS-CoV-2 target nucleic acids are NOT DETECTED.  The SARS-CoV-2 RNA is generally detectable in upper respiratory specimens during the acute phase of infection. The lowest concentration of SARS-CoV-2 viral copies this assay can detect is 138 copies/mL. A negative result does not preclude SARS-Cov-2 infection and should not be used as the sole basis for treatment or other patient management decisions. A negative result may occur with  improper specimen collection/handling, submission of specimen other than nasopharyngeal swab, presence of viral mutation(s) within the areas targeted by this assay, and inadequate number of viral copies(<138 copies/mL). A negative result must be combined with clinical observations, patient history, and epidemiological information. The expected result is Negative.  Fact Sheet for Patients:  EntrepreneurPulse.com.au  Fact Sheet for Healthcare Providers:  IncredibleEmployment.be  This test is no t yet approved or cleared by the [place] FDA and  has been authorized for detection and/or diagnosis of SARS-CoV-2 by FDA under an Emergency Use Authorization (EUA). This EUA will remain  in effect (meaning this test can be used) for the duration of the COVID-19  declaration under Section 564(b)(1) of the Act, 21 U.S.C.section 360bbb-3(b)(1), unless the authorization is terminated  or revoked sooner.       Influenza A by PCR NEGATIVE NEGATIVE   Influenza B by PCR NEGATIVE NEGATIVE    Comment: (NOTE) The Xpert Xpress SARS-CoV-2/FLU/RSV plus assay is intended as an aid in the diagnosis of influenza from Nasopharyngeal swab specimens and should not be used as a sole basis for treatment. Nasal washings and aspirates are unacceptable for Xpert Xpress SARS-CoV-2/FLU/RSV testing.  Fact Sheet for Patients: EntrepreneurPulse.com.au  Fact Sheet for Healthcare Providers: IncredibleEmployment.be  This test is not yet approved or cleared by the [place] FDA and has been authorized for detection and/or diagnosis of SARS-CoV-2 by FDA under an Emergency Use Authorization (EUA). This EUA will remain in effect (meaning this test can be used) for the duration of the  COVID-19 declaration under Section 564(b)(1) of the Act, 21 U.S.C. section 360bbb-3(b)(1), unless the authorization is terminated or revoked.     Resp Syncytial Virus by PCR NEGATIVE NEGATIVE    Comment: (NOTE) Fact Sheet for Patients: EntrepreneurPulse.com.au  Fact Sheet for Healthcare Providers: IncredibleEmployment.be  This test is not yet approved or cleared by the [place] FDA and has been authorized for detection and/or diagnosis of SARS-CoV-2 by FDA under an Emergency Use Authorization (EUA). This EUA will remain in effect (meaning this test can be used) for the duration of the COVID-19 declaration under Section 564(b)(1) of the Act, 21 U.S.C. section 360bbb-3(b)(1), unless the authorization is terminated or revoked.  Performed at [facility], [place] [address], [place], [place] [address]     Blood Alcohol level:  Lab Results  Component Value Date   ETH <10 16/06/3709     Metabolic Disorder Labs: Lab Results  Component Value Date   HGBA1C 4.8 01/02/2023   MPG 91.06 01/02/2023   MPG 88.19 12/29/2022   No results found for: "PROLACTIN" Lab Results  Component Value Date   CHOL 128 01/02/2023   TRIG 34 01/02/2023   HDL 54 01/02/2023   CHOLHDL 2.4 01/02/2023   VLDL 7 01/02/2023   LDLCALC 67 01/02/2023   LDLCALC 88 12/29/2022    Physical Findings: AIMS: Facial and Oral Movements Muscles of Facial Expression: None, normal Lips and Perioral Area: None, normal Jaw: None, normal Tongue: None, normal,Extremity Movements Upper (arms, wrists, hands, fingers): None, normal Lower (legs, knees, ankles, toes): None, normal, Trunk Movements Neck, shoulders, hips: None, normal, Overall Severity Severity of abnormal movements (highest score from questions above): None, normal Incapacitation due to abnormal movements: None, normal Patient's awareness of abnormal movements (rate only patient's report): No Awareness, Dental Status Current problems with teeth and/or dentures?: No Does patient usually wear dentures?: No    Musculoskeletal: Strength & Muscle Tone: within normal limits Gait & Station: normal Patient leans: N/A   Psychiatric Specialty Exam:   General Appearance: appears at stated age, casually dressed and slightly disheveled   Behavior: confused and somewhat cooperative   Psychomotor Activity: no psychomotor agitation or retardation noted    Eye Contact: fair  Speech: normal amount, tone, volume. Impaired fluency, at baseline she has a speech impediment      Mood: anxious Affect: congruent   Thought Process: less tangential Descriptions of Associations: loose   Thought Content Hallucinations: reports visual hallucinations but no AH,  does not appear responding to stimuli during assessment Delusions: paranoia present Suicidal Thoughts: denies SI, intention, plan  Homicidal Thoughts: denies HI, intention, plan     Alertness/Orientation: A&O x 3,  knew her name, date, and place Insight: poor Judgment: poor   Memory: limited    Executive Functions  Concentration: limited  Attention Span: limited  Recall: limited  Fund of Knowledge: limited     Animal nutritionist; Desire for Improvement; Resilience; Leisure Time    Physical Exam  Constitutional:      Appearance: Normal appearance.  Cardiovascular:     Rate and Rhythm: Normal rate.  Pulmonary:     Effort: Pulmonary effort is normal.  Neurological:     General: No focal deficit present.     Mental Status: Alert and oriented to person, place, and time.    Review of Systems  Constitutional: Negative.  Negative for chills, fever and weight loss.  HENT: Negative.    Eyes: Negative.   Respiratory: Negative.  Cardiovascular: Negative.   Gastrointestinal:  Negative for constipation, diarrhea, nausea and vomiting.  Genitourinary: Negative.   Musculoskeletal: Negative.   Skin: Negative.   Neurological: Negative.  Negative for tingling.      ASSESSMENT:  Diagnoses / Active Problems: Principal Problem: Brief psychotic disorder ([place]) Diagnosis: Principal Problem:   Brief psychotic disorder ([place]) Active Problems:   Suicidal ideation   Speech impediment   Generalized anxiety disorder   Major depressive disorder, recurrent episode ([place])   PLAN: Safety and Monitoring:  -- Involuntary admission to inpatient psychiatric unit for safety, stabilization and treatment  -- Daily contact with patient to assess and evaluate symptoms and progress in treatment  -- Patient's case to be discussed in multi-disciplinary team meeting  -- Observation Level : q15 minute checks  -- Vital signs:  q12 hours  -- Precautions: suicide, elopement, and assault  2. Medications:    Psychiatric Diagnosis and treatment  Brief Psychotic Disorder of unknown origin - Continue Risperdal to 1 mg qAM, 1 mg qPM, 2 mg qHS  (increased from 1 mg  q8H) - Continue Ativan to 0.25 mg q8H  - Continue Lithobid 300 mg q12 with meals for elevated mood  - Ordered lithium level for 1/23 - Ordered first psychotic break labs   Generalized Anxiety Disorder - Continue propranolol 20 mg q8H due to tachycardia - Continue Ativan to 2 mg q6H PRN if unresponsive to Atarax - Atarax 25 mg TID oral as needed for anxiety   Major Depressive Disorder - Lexapro taper finished 1/17   Agitation Protocol Benadryl 50 mg TID IV and oral as needed for agitation Haldol 5 mg TID IV and oral as needed for agitation Ativan 2 mg TID IM and oral as needed for agitation   Other as needed medications  Trazodone 50 mg at bedtime as needed for insomnia   Patient does not need nicotine replacement   Medical Diagnosis and Treatment   Possible URI - Respiratory panel negative  Abnormal Urine Analysis - UA from the ED positive for large leukocytes, rare bacteria. Will repeat due to concern for UTI   Other as needed medications  Tylenol 650 mg every 6 hours as needed for pain Mylanta 30 mL every 4 hours as needed for indigestion Milk of magnesia 30 mL daily as needed for constipation     The risks/benefits/side-effects/alternatives to the above medication were discussed in detail with the patient and time was given for questions. The patient consents to medication trial. FDA black box warnings, if present, were discussed.   The patient is agreeable with the medication plan, as above. We will monitor the patient's response to pharmacologic treatment, and adjust medications as necessary.   3. Routine and other pertinent labs: EKG monitoring: QTc: 444   Metabolism / endocrine: BMI: Body mass index is 26.61 kg/m.   Pending: Heavy metals Vitamin B1    Results: Ceruloplasmin: Normal ANA w/Reflex if Positive: Normal HIV-1 RNA: Normal N-methyl-D-Aspartate : Normal Repeat UA trace leukocytes, no bacteria Vitamin B12 WNL Folate WNL RPR WNL A1c  4.8 Lipid Panel WNL CBC: Normal CMP: Normal TSH: Normal UDS: Positive for benzodiazepines (reported given by mother for panic attack last week)    5. Group and Therapy: -- Encouraged patient to participate in unit milieu and in scheduled group therapies     -- Short Term Goals: Ability to identify changes in lifestyle to reduce recurrence of condition will improve, Ability to verbalize feelings will improve, Ability to disclose and discuss suicidal ideas,  and Ability to identify and develop effective coping behaviors will improve  -- Long Term Goals: Improvement in symptoms so as ready for discharge  6. Discharge Planning:   -- Social work and case management to assist with discharge planning and identification of hospital follow-up needs prior to discharge  -- Estimated LOS: 5-7 days  -- Discharge Concerns: Need to establish a safety plan; Medication compliance and effectiveness  -- Discharge Goals: Return home with outpatient referrals for mental health follow-up including medication management/psychotherapy      Total Time Spent in Direct Patient Care:  I personally spent 45 minutes on the unit in direct patient care. The direct patient care time included face-to-face time with the patient, reviewing the patient's chart, communicating with other professionals, and coordinating care. Greater than 50% of this time was spent in counseling or coordinating care with the patient regarding goals of hospitalization, psycho-education, and discharge planning needs.   Coralyn Pear PGY-1 Psychiatry 01/05/2023, 7:02 AM

## 2023-01-05 NOTE — Progress Notes (Signed)
   01/05/23 0604  15 Minute Checks  Location Dayroom  Visual Appearance Calm  Behavior Composed  Sleep (Behavioral Health Patients Only)  Calculate sleep? (Click Yes once per 24 hr at 0600 safety check) Yes  Documented sleep last 24 hours 7.5

## 2023-01-05 NOTE — Group Note (Signed)
LCSW Group Therapy Note  Group Date: 01/05/2023 Start Time: 1115 End Time: 1145   Type of Therapy and Topic:  Group Therapy - How To Cope with Nervousness about Discharge   Participation Level:  Minimal   Description of Group This process group involved identification of patients' feelings about discharge. Some of them are scheduled to be discharged soon, while others are new admissions, but each of them was asked to share thoughts and feelings surrounding discharge from the hospital. One common theme was that they are excited at the prospect of going home, while another was that many of them are apprehensive about sharing why they were hospitalized. Patients were given the opportunity to discuss these feelings with their peers in preparation for discharge.  Therapeutic Goals  Patient will identify their overall feelings about pending discharge. Patient will think about how they might proactively address issues that they believe will once again arise once they get home (i.e. with parents). Patients will participate in discussion about having hope for change.   Summary of Patient Progress:  Caron was present but very sedated throughout the session. Her words were so slurred as to be incomprehensible.  She kept falling asleep.  She stated she would like to discharge to be "at home alone."   Therapeutic Modalities Cognitive Behavioral Therapy   Marquette Old 01/05/2023  2:57 PM

## 2023-01-05 NOTE — Progress Notes (Signed)
   01/04/23 2046  Psych Admission Type (Psych Patients Only)  Admission Status Voluntary  Psychosocial Assessment  Patient Complaints Anxiety  Eye Contact Fair  Facial Expression Anxious  Affect Anxious;Preoccupied  Speech Tangential  Interaction Cautious;Childlike  Motor Activity Fidgety  Appearance/Hygiene Improved  Behavior Characteristics Cooperative;Appropriate to situation;Anxious  Mood Anxious;Pleasant  Thought Process  Coherency Disorganized  Content Preoccupation  Delusions None reported or observed  Perception Hallucinations  Hallucination Visual  Judgment Poor  Confusion None  Danger to Self  Current suicidal ideation? Denies

## 2023-01-05 NOTE — Progress Notes (Signed)
   01/05/23 1300  Psych Admission Type (Psych Patients Only)  Admission Status Voluntary  Psychosocial Assessment  Patient Complaints None  Eye Contact Fair  Facial Expression Anxious  Affect Preoccupied  Speech Tangential  Interaction Childlike  Motor Activity Pacing  Appearance/Hygiene Unremarkable  Behavior Characteristics Cooperative  Mood Anxious  Thought Process  Coherency Disorganized  Content Preoccupation  Delusions None reported or observed  Perception Hallucinations  Hallucination Visual  Judgment Poor  Confusion None  Danger to Self  Current suicidal ideation? Denies  Danger to Others  Danger to Others None reported or observed

## 2023-01-06 DIAGNOSIS — F23 Brief psychotic disorder: Secondary | ICD-10-CM | POA: Diagnosis not present

## 2023-01-06 NOTE — [facility] Group Notes (Signed)
[facility] Group Notes:  (Nursing/MHT/Case Management/Adjunct)  Date:  01/06/2023  Time:  8:44 PM  Type of Therapy:  Group Therapy  Participation Level:  Active  Participation Quality:  Appropriate  Affect:  Appropriate  Cognitive:  Appropriate  Insight:  Appropriate  Engagement in Group:  Improving  Modes of Intervention:  Discussion  Summary of Progress/Problems:Try to stay inspired.  Floyde Parkins 01/06/2023, 8:44 PM

## 2023-01-06 NOTE — Progress Notes (Signed)
   01/06/23 2114  Psych Admission Type (Psych Patients Only)  Admission Status Voluntary  Psychosocial Assessment  Patient Complaints Worrying  Eye Contact Fair  Facial Expression Flat  Affect Blunted  Speech Tangential  Interaction Cautious;Childlike  Motor Activity Slow  Appearance/Hygiene Improved  Behavior Characteristics Cooperative;Appropriate to situation  Mood Pleasant  Thought Process  Coherency Circumstantial  Content Preoccupation  Delusions None reported or observed  Perception WDL  Hallucination None reported or observed  Judgment Poor  Confusion None  Danger to Self  Current suicidal ideation? Denies

## 2023-01-06 NOTE — Progress Notes (Signed)
[facility] MD Progress Note  01/06/2023 10:39 AM Rebecca Hoover  MRN:  [id number]   Reason for Admission:  Rebecca Hoover is a 22 y.o. female with a history of  Major depressive disorder, recurrent episode ([place]), GAD, speech impediment, SI , who was initially admitted for inpatient psychiatric hospitalization on 01/01/2023 for management of brief psychotic disorder. The patient is currently on Hospital Day 5.   Chart Review from last 24 hours:  The patient's chart was reviewed and nursing notes were reviewed.The patient's case was discussed in multidisciplinary team meeting. Per [facility], patient was taking medications appropriately. Per nursing, the patient appears oversedated and drowsy in the daytime and is only sleeping fair at night and reported 5 hours of sleep.  No PRNs were given today. Information Obtained Today During Patient Interview: Patient seen at bedside this morning.  Patient was sitting in her bed with her eyes half closed.  She maintained fair to poor eye contact.  She appeared to have psychomotor retardation.  She was cooperative with the questioning but appeared to have trouble responding appropriately.  Her speech was of low volume with some latency but no obvious looseness of associations or flight of ideas.  She continues to endorse hearing voices.  She denies any visual hallucinations today.  No adverse side effects of medications are noted than sedation.  Patient apparently had visitation from her mother who is planning to come back today to visit her.  Staff also noted some concern with her heart rate which continues to be high.  This morning it was 140 but rest of the vital signs were stable except for a low normal blood pressure of 94/71.  Pulse seems to be fluctuating between 10 2-1 40.  She is currently on Inderal 20 mg twice a day and Ativan 0.25 mg 3 times daily.  The plan is to discontinue the Ativan.  The patient is also on Risperdal and her EKG of 01/01/2023 shows a QTc of 444 and  sinus tachycardia with intraventricular block and noted to be abnormal.  Considering a consultation at medical.   Principal Problem: Brief psychotic disorder ([place]) Diagnosis: Principal Problem:   Brief psychotic disorder ([place]) Active Problems:   Suicidal ideation   Speech impediment   Generalized anxiety disorder   Major depressive disorder, recurrent episode ([place])    Past Psychiatric History: MDD  Past Medical History: No past medical history on file. No past surgical history on file. Family History: No family history on file. Family Psychiatric  History: Bipolar Disorder  Sleep:  Fair    Appetite:  Fair  Current Medications: Current Facility-Administered Medications  Medication Dose Route Frequency Provider Last Rate Last Admin   acetaminophen (TYLENOL) tablet 650 mg  650 mg Oral Q6H PRN Evette Georges, NP   650 mg at 01/05/23 0624   alum & mag hydroxide-simeth (MAALOX/MYLANTA) 200-200-20 MG/5ML suspension 30 mL  30 mL Oral Q4H PRN Evette Georges, NP       haloperidol (HALDOL) tablet 5 mg  5 mg Oral TID PRN Janine Limbo, MD   5 mg at 01/04/23 0300   And   LORazepam (ATIVAN) tablet 2 mg  2 mg Oral TID PRN Janine Limbo, MD       And   diphenhydrAMINE (BENADRYL) capsule 50 mg  50 mg Oral TID PRN Janine Limbo, MD   50 mg at 01/04/23 0300   haloperidol lactate (HALDOL) injection 5 mg  5 mg Intramuscular TID PRN Janine Limbo, MD  And   LORazepam (ATIVAN) injection 2 mg  2 mg Intramuscular TID PRN Massengill, Ovid Curd, MD       And   diphenhydrAMINE (BENADRYL) injection 50 mg  50 mg Intramuscular TID PRN Massengill, Ovid Curd, MD       feeding supplement (ENSURE ENLIVE / ENSURE PLUS) liquid 237 mL  237 mL Oral BID BM Nkwenti, Doris, NP   237 mL at 01/05/23 1526   hydrOXYzine (ATARAX) tablet 25 mg  25 mg Oral TID PRN Janine Limbo, MD   25 mg at 01/02/23 2355   lithium carbonate (LITHOBID) ER tablet 300 mg  300 mg Oral Q12H Coralyn Pear B, MD   300 mg  at 01/06/23 0854   LORazepam (ATIVAN) tablet 1 mg  1 mg Oral Q6H PRN Massengill, Ovid Curd, MD       magnesium hydroxide (MILK OF MAGNESIA) suspension 30 mL  30 mL Oral Daily PRN Evette Georges, NP   30 mL at 01/05/23 1821   norethindrone-ethinyl estradiol (LOESTRIN) 1-20 MG-MCG tablet 1 tablet  1 tablet Oral Daily Evette Georges, NP       propranolol (INDERAL) tablet 20 mg  20 mg Oral Q8H Massengill, Ovid Curd, MD   20 mg at 01/06/23 6294   risperiDONE (RISPERDAL M-TABS) disintegrating tablet 1 mg  1 mg Oral BID Coralyn Pear B, MD   1 mg at 01/06/23 7654   risperiDONE (RISPERDAL M-TABS) disintegrating tablet 2 mg  2 mg Oral QHS Massengill, Ovid Curd, MD   2 mg at 01/05/23 2037   traZODone (DESYREL) tablet 50 mg  50 mg Oral QHS PRN Janine Limbo, MD   50 mg at 01/02/23 2050    Lab Results:  No results found for this or any previous visit (from the past 75 hour(s)).   Blood Alcohol level:  Lab Results  Component Value Date   ETH <10 65/02/5464    Metabolic Disorder Labs: Lab Results  Component Value Date   HGBA1C 4.8 01/02/2023   MPG 91.06 01/02/2023   MPG 88.19 12/29/2022   No results found for: "PROLACTIN" Lab Results  Component Value Date   CHOL 128 01/02/2023   TRIG 34 01/02/2023   HDL 54 01/02/2023   CHOLHDL 2.4 01/02/2023   VLDL 7 01/02/2023   LDLCALC 67 01/02/2023   LDLCALC 88 12/29/2022    Physical Findings: AIMS: Facial and Oral Movements Muscles of Facial Expression: None, normal Lips and Perioral Area: None, normal Jaw: None, normal Tongue: None, normal,Extremity Movements Upper (arms, wrists, hands, fingers): None, normal Lower (legs, knees, ankles, toes): None, normal, Trunk Movements Neck, shoulders, hips: None, normal, Overall Severity Severity of abnormal movements (highest score from questions above): None, normal Incapacitation due to abnormal movements: None, normal Patient's awareness of abnormal movements (rate only patient's report): No Awareness,  Dental Status Current problems with teeth and/or dentures?: No Does patient usually wear dentures?: No    Musculoskeletal: Strength & Muscle Tone: within normal limits Gait & Station: normal Patient leans: N/A   Psychiatric Specialty Exam:   General Appearance: appears at stated age, casually dressed and slightly disheveled   Behavior: confused and somewhat cooperative   Psychomotor Activity: no psychomotor agitation or retardation noted    Eye Contact: fair  Speech: normal amount, tone, volume. Impaired fluency, at baseline she has a speech impediment      Mood: anxious Affect: congruent   Thought Process: less tangential Descriptions of Associations: loose   Thought Content Hallucinations: reports visual hallucinations but no AH,  does not appear  responding to stimuli during assessment Delusions: paranoia present Suicidal Thoughts: denies SI, intention, plan  Homicidal Thoughts: denies HI, intention, plan    Alertness/Orientation: A&O x 3,  knew her name, date, and place Insight: poor Judgment: poor   Memory: limited    Executive Functions  Concentration: limited  Attention Span: limited  Recall: limited  Fund of Knowledge: limited     Assets  Desire for Improvement; Resilience; Leisure Time; Social Support    Physical Exam  Constitutional:      Appearance: Normal appearance.  Cardiovascular:     Rate and Rhythm: Normal rate.  Pulmonary:     Effort: Pulmonary effort is normal.  Neurological:     General: No focal deficit present.     Mental Status: Alert and oriented to person, place, and time.    Review of Systems  Constitutional: Negative.  Negative for chills, fever and weight loss.  HENT: Negative.    Eyes: Negative.   Respiratory: Negative.    Cardiovascular: Negative.   Gastrointestinal:  Negative for constipation, diarrhea, nausea and vomiting.  Genitourinary: Negative.   Musculoskeletal: Negative.   Skin: Negative.   Neurological:  Negative.  Negative for tingling.      ASSESSMENT:  Diagnoses / Active Problems: Principal Problem: Brief psychotic disorder ([place]) Diagnosis: Principal Problem:   Brief psychotic disorder ([place]) Active Problems:   Suicidal ideation   Speech impediment   Generalized anxiety disorder   Major depressive disorder, recurrent episode ([place])   PLAN: Safety and Monitoring:  -- Involuntary admission to inpatient psychiatric unit for safety, stabilization and treatment  -- Daily contact with patient to assess and evaluate symptoms and progress in treatment  -- Patient's case to be discussed in multi-disciplinary team meeting  -- Observation Level : q15 minute checks  -- Vital signs:  q12 hours  -- Precautions: suicide, elopement, and assault  2. Medications:    Psychiatric Diagnosis and treatment  Brief Psychotic Disorder of unknown origin - Continue Risperdal to 1 mg qAM, 1 mg qPM, 2 mg qHS  (increased from 1 mg q8H) -Discontinue  Ativan to 0.25 mg q8H on 01/06/2023 - Continue Lithobid 300 mg q12 with meals for elevated mood  - Ordered lithium level for 1/23 - Ordered first psychotic break labs   Generalized Anxiety Disorder - Continue propranolol 20 mg q8H due to tachycardia - Continue Ativan to 2 mg q6H PRN if unresponsive to Atarax - Atarax 25 mg TID oral as needed for anxiety   Major Depressive Disorder - Lexapro taper finished 1/17   Agitation Protocol Benadryl 50 mg TID IV and oral as needed for agitation Haldol 5 mg TID IV and oral as needed for agitation Ativan 2 mg TID IM and oral as needed for agitation   Other as needed medications  Trazodone 50 mg at bedtime as needed for insomnia   Patient does not need nicotine replacement   Medical Diagnosis and Treatment:  The patient has persistent sinus tachycardia with a QTc interval of 444 and an intraventricular block.  Will request a medical consult.   Possible URI - Respiratory panel negative  Abnormal Urine  Analysis - UA from the ED positive for large leukocytes, rare bacteria. Will repeat due to concern for UTI   Other as needed medications  Tylenol 650 mg every 6 hours as needed for pain Mylanta 30 mL every 4 hours as needed for indigestion Milk of magnesia 30 mL daily as needed for constipation     The risks/benefits/side-effects/alternatives to  the above medication were discussed in detail with the patient and time was given for questions. The patient consents to medication trial. FDA black box warnings, if present, were discussed.   The patient is agreeable with the medication plan, as above. We will monitor the patient's response to pharmacologic treatment, and adjust medications as necessary.   3. Routine and other pertinent labs: EKG monitoring: QTc: 444   Metabolism / endocrine: BMI: Body mass index is 26.61 kg/m.   Pending: Heavy metals Vitamin B1    Results: Ceruloplasmin: Normal ANA w/Reflex if Positive: Normal HIV-1 RNA: Normal N-methyl-D-Aspartate : Normal Repeat UA trace leukocytes, no bacteria Vitamin B12 WNL Folate WNL RPR WNL A1c 4.8 Lipid Panel WNL CBC: Normal CMP: Normal TSH: Normal UDS: Positive for benzodiazepines (reported given by mother for panic attack last week)    5. Group and Therapy: -- Encouraged patient to participate in unit milieu and in scheduled group therapies     -- Short Term Goals: Ability to identify changes in lifestyle to reduce recurrence of condition will improve, Ability to verbalize feelings will improve, Ability to disclose and discuss suicidal ideas, and Ability to identify and develop effective coping behaviors will improve  -- Long Term Goals: Improvement in symptoms so as ready for discharge  6. Discharge Planning:   -- Social work and case management to assist with discharge planning and identification of hospital follow-up needs prior to discharge  -- Estimated LOS: 5-7 days  -- Discharge Concerns: Need to establish  a safety plan; Medication compliance and effectiveness  -- Discharge Goals: Return home with outpatient referrals for mental health follow-up including medication management/psychotherapy      Total Time Spent in Direct Patient Care:  I personally spent 45 minutes on the unit in direct patient care. The direct patient care time included face-to-face time with the patient, reviewing the patient's chart, communicating with other professionals, and coordinating care. Greater than 50% of this time was spent in counseling or coordinating care with the patient regarding goals of hospitalization, psycho-education, and discharge planning needs.   Ranae Palms, MD  Total Time Spent in Direct Patient Care:  I personally spent 30 minutes on the unit in direct patient care. The direct patient care time included face-to-face time with the patient, reviewing the patient's chart, communicating with other professionals, and coordinating care. Greater than 50% of this time was spent in counseling or coordinating care with the patient regarding goals of hospitalization, psycho-education, and discharge planning needs.   Jalayiah Bibian [facility] Psychiatrist   01/06/2023, 10:39 AM Patient ID: Bobette Mo, female   DOB: 27-Nov-2001, 22 y.o.   MRN: [id number]

## 2023-01-06 NOTE — Progress Notes (Signed)
   01/05/23 2355  Psych Admission Type (Psych Patients Only)  Admission Status Voluntary  Psychosocial Assessment  Patient Complaints Worrying  Eye Contact Fair  Facial Expression Anxious  Affect Anxious;Preoccupied  Speech Tangential  Interaction Cautious;Childlike  Motor Activity Fidgety  Appearance/Hygiene Improved  Behavior Characteristics Fidgety  Mood Pleasant  Thought Process  Coherency Disorganized  Content Preoccupation  Delusions None reported or observed  Perception Hallucinations  Hallucination Visual  Judgment Poor  Confusion None  Danger to Self  Current suicidal ideation? Denies

## 2023-01-06 NOTE — Progress Notes (Signed)
   01/06/23 7207  15 Minute Checks  Location Dayroom  Visual Appearance Calm  Behavior Composed  Sleep (Behavioral Health Patients Only)  Calculate sleep? (Click Yes once per 24 hr at 0600 safety check) Yes  Documented sleep last 24 hours 5

## 2023-01-06 NOTE — Progress Notes (Signed)
Pt sleeping on and off today. Pt medication compliant. Pt eating meals. Pt endorses continued hallucinations. Denies suicidal/homicidal thoughts. Fluids encouraged. Q 15 minute checks ongoing.

## 2023-01-07 DIAGNOSIS — F23 Brief psychotic disorder: Secondary | ICD-10-CM | POA: Diagnosis not present

## 2023-01-07 MED ORDER — PROPRANOLOL HCL 20 MG PO TABS
20.0000 mg | ORAL_TABLET | Freq: Three times a day (TID) | ORAL | Status: AC
  Administered 2023-01-07 (×2): 20 mg via ORAL
  Filled 2023-01-07 (×2): qty 1

## 2023-01-07 MED ORDER — PROPRANOLOL HCL ER 60 MG PO CP24
60.0000 mg | ORAL_CAPSULE | Freq: Every day | ORAL | Status: DC
  Administered 2023-01-08 – 2023-01-18 (×11): 60 mg via ORAL
  Filled 2023-01-07 (×14): qty 1

## 2023-01-07 NOTE — Group Note (Signed)
LCSW Group Therapy Note   Group Date: 01/07/2023 Start Time: 1300 End Time: 1400   Type of Therapy and Topic:  Group Therapy: Boundaries  Participation Level:  Did Not Attend  Description of Group: This group will address the use of boundaries in their personal lives. Patients will explore why boundaries are important, the difference between healthy and unhealthy boundaries, and negative and postive outcomes of different boundaries and will look at how boundaries can be crossed.  Patients will be encouraged to identify current boundaries in their own lives and identify what kind of boundary is being set. Facilitators will guide patients in utilizing problem-solving interventions to address and correct types boundaries being used and to address when no boundary is being used. Understanding and applying boundaries will be explored and addressed for obtaining and maintaining a balanced life. Patients will be encouraged to explore ways to assertively make their boundaries and needs known to significant others in their lives, using other group members and facilitator for role play, support, and feedback.  Therapeutic Goals:  1.  Patient will identify areas in their life where setting clear boundaries could be  used to improve their life.  2.  Patient will identify signs/triggers that a boundary is not being respected. 3.  Patient will identify two ways to set boundaries in order to achieve balance in  their lives: 4.  Patient will demonstrate ability to communicate their needs and set boundaries  through discussion and/or role plays  Summary of Patient Progress:  x  Therapeutic Modalities:   Cognitive Sandoval, LCSW 01/07/2023  3:39 PM

## 2023-01-07 NOTE — Group Note (Signed)
Recreation Therapy Group Note   Group Topic:Health and Wellness  Group Date: 01/07/2023 Start Time: 1004 End Time: 1030 Facilitators: Nithya Meriweather-McCall, LRT,CTRS Location: [address] Dayroom   Goal Area(s) Addresses:  Patient will verbalize benefit of exercise during group session. Patient will verbalize an exercise that can be completed in their hospital room during admission. Patient will identify an exercise that can be completed post d/c. Patient will acknowledge benefits of exercise when used as a coping mechanism.    Group Description:  Exercise.  LRT and patients discussed the importance of exercise and how it benefits the individual.  LRT then explained to patients, they would be leading the group.  LRT explained each person would have the chance to lead the group in an exercise, stretch or dance move of their choosing.  Patients were informed to be mindful of the exercises they choose and to take in consideration their peers and any limitations they may have.  Patients were encouraged to have a good time and move!   Affect/Mood: Appropriate   Participation Level: Minimal   Participation Quality: Independent   Behavior: Attentive    Speech/Thought Process: Barely audible    Insight: Poor   Judgement: Poor   Modes of Intervention: Music and Exercise   Patient Response to Interventions:  Attentive and Receptive   Education Outcome:  Acknowledges education and In group clarification offered    Clinical Observations/Individualized Feedback: Pt came in late to group.  Pt had minimal engagement in group session but was receptive to some.  Pt did appear bright and took some encouragement to get patient to participate.   Plan: Continue to engage patient in RT group sessions 2-3x/week.   Genevieve Arbaugh-McCall, LRT,CTRS  01/07/2023 1:49 PM

## 2023-01-07 NOTE — Progress Notes (Signed)
[facility] MD Progress Note  01/07/2023 12:38 PM Rebecca Hoover  MRN:  [id number]   Reason for Admission:  Rebecca Hoover is a 22 y.o. female with a history of  Major depressive disorder, recurrent episode ([place]), GAD, speech impediment, SI , who was initially admitted for inpatient psychiatric hospitalization on 01/01/2023 for management of brief psychotic disorder. The patient is currently on Hospital Day 6.   Yesterday the psychiatry team made the following recommendations: - Continue Risperdal to 1 mg qAM, 1 mg qPM, 2 mg qHS  (increased from 1 mg q8H) -Discontinue  Ativan to 0.25 mg q8H on 01/06/2023 - Continue Lithobid 300 mg q12 with meals for elevated mood  - Ordered lithium level for 1/23  On assessment today, the patient continues to present with psychotic symptoms, however less since admission.  Disorganized thinking is less.  Pilar Plate delusions are not present today.  She denies having auditory hallucinations and does not appear to be responding to internal stimuli.  Her reporting of symptoms is inconsistent, day-to-day (ie denying today having auditory hallucinations since admission, but reporting this multiple times to me and other providers during this hospitalization, as recently as yesterday).  she still continues to be confused, A&O x 2, often not providing logical responses to questions asked.  Reports that sleep and appetite are okay.  Denies SI, HI.  Denies VH, paranoia, and other psychotic symptoms.  I reviewed her lab results with her today.  Discussed she will have labs drawn tomorrow morning as well. Repeat EKG today as well.   We will decrease Risperdal dose, to reduce sedation, as psychosis is starting to clear.    Principal Problem: Brief psychotic disorder ([place]) Diagnosis: Principal Problem:   Brief psychotic disorder ([place]) Active Problems:   Suicidal ideation   Speech impediment   Generalized anxiety disorder   Major depressive disorder, recurrent episode  ([place])    Past Psychiatric History: MDD  Past Medical History: No past medical history on file. No past surgical history on file. Family History: No family history on file. Family Psychiatric  History: Bipolar Disorder  Sleep:  Fair    Appetite:  Fair  Current Medications: Current Facility-Administered Medications  Medication Dose Route Frequency Provider Last Rate Last Admin   acetaminophen (TYLENOL) tablet 650 mg  650 mg Oral Q6H PRN Evette Georges, NP   650 mg at 01/06/23 2007   alum & mag hydroxide-simeth (MAALOX/MYLANTA) 200-200-20 MG/5ML suspension 30 mL  30 mL Oral Q4H PRN Evette Georges, NP       haloperidol (HALDOL) tablet 5 mg  5 mg Oral TID PRN Janine Limbo, MD   5 mg at 01/04/23 0300   And   LORazepam (ATIVAN) tablet 2 mg  2 mg Oral TID PRN Janine Limbo, MD       And   diphenhydrAMINE (BENADRYL) capsule 50 mg  50 mg Oral TID PRN Janine Limbo, MD   50 mg at 01/04/23 0300   haloperidol lactate (HALDOL) injection 5 mg  5 mg Intramuscular TID PRN Janine Limbo, MD       And   LORazepam (ATIVAN) injection 2 mg  2 mg Intramuscular TID PRN Zykera Abella, Ovid Curd, MD       And   diphenhydrAMINE (BENADRYL) injection 50 mg  50 mg Intramuscular TID PRN Chandlor Noecker, Ovid Curd, MD       feeding supplement (ENSURE ENLIVE / ENSURE PLUS) liquid 237 mL  237 mL Oral BID BM Nkwenti, Doris, NP   237 mL at 01/07/23 1100  hydrOXYzine (ATARAX) tablet 25 mg  25 mg Oral TID PRN Janine Limbo, MD   25 mg at 01/02/23 2355   lithium carbonate (LITHOBID) ER tablet 300 mg  300 mg Oral Q12H Coralyn Pear B, MD   300 mg at 01/07/23 0756   LORazepam (ATIVAN) tablet 1 mg  1 mg Oral Q6H PRN Tremar Wickens, Ovid Curd, MD       magnesium hydroxide (MILK OF MAGNESIA) suspension 30 mL  30 mL Oral Daily PRN Evette Georges, NP   30 mL at 01/06/23 2007   norethindrone-ethinyl estradiol (LOESTRIN) 1-20 MG-MCG tablet 1 tablet  1 tablet Oral Daily Evette Georges, NP       propranolol (INDERAL) tablet 20  mg  20 mg Oral Q8H Ruta Capece, Ovid Curd, MD       Derrill Memo ON 01/08/2023] propranolol ER (INDERAL LA) 24 hr capsule 60 mg  60 mg Oral Daily Zakkiyya Barno, MD       risperiDONE (RISPERDAL M-TABS) disintegrating tablet 1 mg  1 mg Oral BID Coralyn Pear B, MD   1 mg at 01/07/23 0757   risperiDONE (RISPERDAL M-TABS) disintegrating tablet 2 mg  2 mg Oral QHS Daiveon Markman, MD   2 mg at 01/06/23 2100   traZODone (DESYREL) tablet 50 mg  50 mg Oral QHS PRN Janine Limbo, MD   50 mg at 01/02/23 2050    Lab Results:  No results found for this or any previous visit (from the past 67 hour(s)).   Blood Alcohol level:  Lab Results  Component Value Date   ETH <10 59/16/3846    Metabolic Disorder Labs: Lab Results  Component Value Date   HGBA1C 4.8 01/02/2023   MPG 91.06 01/02/2023   MPG 88.19 12/29/2022   No results found for: "PROLACTIN" Lab Results  Component Value Date   CHOL 128 01/02/2023   TRIG 34 01/02/2023   HDL 54 01/02/2023   CHOLHDL 2.4 01/02/2023   VLDL 7 01/02/2023   LDLCALC 67 01/02/2023   LDLCALC 88 12/29/2022    Physical Findings: AIMS: Facial and Oral Movements Muscles of Facial Expression: None, normal Lips and Perioral Area: None, normal Jaw: None, normal Tongue: None, normal,Extremity Movements Upper (arms, wrists, hands, fingers): None, normal Lower (legs, knees, ankles, toes): None, normal, Trunk Movements Neck, shoulders, hips: None, normal, Overall Severity Severity of abnormal movements (highest score from questions above): None, normal Incapacitation due to abnormal movements: None, normal Patient's awareness of abnormal movements (rate only patient's report): No Awareness, Dental Status Current problems with teeth and/or dentures?: No Does patient usually wear dentures?: No    Musculoskeletal: Strength & Muscle Tone: within normal limits Gait & Station: normal Patient leans: N/A   Psychiatric Specialty Exam:   General Appearance:  appears at stated age, casually dressed   Behavior: confused and cooperative   Psychomotor Activity: no psychomotor agitation or retardation noted    Eye Contact: fair  Speech: normal amount, tone, volume. Impaired fluency, at baseline she has a speech impediment      Mood: anxious Affect: congruent   Thought Process: less tangential Descriptions of Associations: loose   Thought Content-logical at times, illogical at times Hallucinations: Denies AH and VH.  Does not appear to be responding to internal stimuli. Delusions: Denies paranoid thoughts Suicidal Thoughts: denies SI, intention, plan  Homicidal Thoughts: denies HI, intention, plan    Alertness/Orientation: A&O x 3,  knew her name, date, and place Insight: poor Judgment: poor   Memory: limited    Executive Functions  Concentration:  limited  Attention Span: limited  Recall: limited  Fund of Knowledge: limited     Assets  Desire for Improvement; Resilience; Leisure Time; Social Support    Physical Exam  Constitutional:      Appearance: Normal appearance.  Cardiovascular:     Rate and Rhythm: Normal rate.  Pulmonary:     Effort: Pulmonary effort is normal.  Neurological:     General: No focal deficit present.     Mental Status: Alert and oriented to person, place, and time.    Review of Systems  Constitutional: Negative.  Negative for chills, fever and weight loss.  HENT: Negative.    Eyes: Negative.   Respiratory: Negative.    Cardiovascular: Negative.   Gastrointestinal:  Negative for constipation, diarrhea, nausea and vomiting.  Genitourinary: Negative.   Musculoskeletal: Negative.   Skin: Negative.   Neurological: Negative.  Negative for tingling.      ASSESSMENT:  Diagnoses / Active Problems: Principal Problem: Brief psychotic disorder ([place]) Diagnosis: Principal Problem:   Brief psychotic disorder ([place]) Active Problems:   Suicidal ideation   Speech impediment   Generalized anxiety  disorder   Major depressive disorder, recurrent episode ([place])   PLAN: Safety and Monitoring:  -- Involuntary admission to inpatient psychiatric unit for safety, stabilization and treatment  -- Daily contact with patient to assess and evaluate symptoms and progress in treatment  -- Patient's case to be discussed in multi-disciplinary team meeting  -- Observation Level : q15 minute checks  -- Vital signs:  q12 hours  -- Precautions: suicide, elopement, and assault  2. Medications:    Psychiatric Diagnosis and treatment  Brief Psychotic Disorder of unknown origin - Decrease Risperdal to 1 mg qAM, 1 mg qPM, 1 mg qHS   - Continue Lithobid 300 mg q12 with meals for elevated mood  - Ordered lithium level for 1/23 -Previously discontinued Ativan - Ordered first psychotic break labs   Generalized Anxiety Disorder - Continue propranolol 20 mg q8H for 2x doses, then change to propranolol ER 60 mg once daily tomorrow 01-08-23.  - Continue Ativan to 2 mg q6H PRN if unresponsive to Atarax -Continue Atarax 25 mg TID oral as needed for anxiety   Major Depressive Disorder -Previously completed Lexapro taper   Agitation Protocol Benadryl 50 mg TID IV and oral as needed for agitation Haldol 5 mg TID IV and oral as needed for agitation Ativan 2 mg TID IM and oral as needed for agitation   Other as needed medications  Trazodone 50 mg at bedtime as needed for insomnia   Patient does not need nicotine replacement   Medical Diagnosis and Treatment:  The patient has persistent sinus tachycardia with a QTc interval of 444 and an intraventricular block.  Will request a medical consult.   Possible URI - Respiratory panel negative  Abnormal Urine Analysis - UA from the ED positive for large leukocytes, rare bacteria. Will repeat due to concern for UTI   Other as needed medications  Tylenol 650 mg every 6 hours as needed for pain Mylanta 30 mL every 4 hours as needed for indigestion Milk of  magnesia 30 mL daily as needed for constipation     The risks/benefits/side-effects/alternatives to the above medication were discussed in detail with the patient and time was given for questions. The patient consents to medication trial. FDA black box warnings, if present, were discussed.   The patient is agreeable with the medication plan, as above. We will monitor the patient's response to  pharmacologic treatment, and adjust medications as necessary.   3. Routine and other pertinent labs: EKG monitoring: QTc: 444  Repeat EKG ordered    Results: Ceruloplasmin: Normal ANA w/Reflex if Positive: Normal HIV-1 RNA: Normal N-methyl-D-Aspartate : Normal Repeat UA trace leukocytes, no bacteria Vitamin B12 WNL Folate WNL RPR WNL A1c 4.8 Lipid Panel WNL CBC: Normal CMP: Normal TSH: Normal UDS: Positive for benzodiazepines (reported given by mother for panic attack last week)    5. Group and Therapy: -- Encouraged patient to participate in unit milieu and in scheduled group therapies     -- Short Term Goals: Ability to identify changes in lifestyle to reduce recurrence of condition will improve, Ability to verbalize feelings will improve, Ability to disclose and discuss suicidal ideas, and Ability to identify and develop effective coping behaviors will improve  -- Long Term Goals: Improvement in symptoms so as ready for discharge  6. Discharge Planning:   -- Social work and case management to assist with discharge planning and identification of hospital follow-up needs prior to discharge  -- Estimated LOS: 5-7 days  -- Discharge Concerns: Need to establish a safety plan; Medication compliance and effectiveness  -- Discharge Goals: Return home with outpatient referrals for mental health follow-up including medication management/psychotherapy   Janine Limbo, MD Psychiatrist  Total Time Spent in Direct Patient Care:  I personally spent 35 minutes on the unit in direct patient  care. The direct patient care time included face-to-face time with the patient, reviewing the patient's chart, communicating with other professionals, and coordinating care. Greater than 50% of this time was spent in counseling or coordinating care with the patient regarding goals of hospitalization, psycho-education, and discharge planning needs.   Janine Limbo, MD Psychiatrist

## 2023-01-07 NOTE — Progress Notes (Signed)
   01/07/23 1700  Psych Admission Type (Psych Patients Only)  Admission Status Voluntary  Psychosocial Assessment  Patient Complaints Worrying  Eye Contact Fair  Facial Expression Flat  Affect Depressed  Speech Tangential  Interaction Cautious  Motor Activity Slow  Appearance/Hygiene Unremarkable  Behavior Characteristics Cooperative  Mood Pleasant;Depressed  Thought Process  Coherency Circumstantial  Content Preoccupation  Delusions None reported or observed  Perception WDL  Hallucination None reported or observed  Judgment Poor  Confusion None  Danger to Self  Current suicidal ideation? Denies  Danger to Others  Danger to Others None reported or observed

## 2023-01-07 NOTE — Progress Notes (Signed)
Pt medication compliant. Some disorganization noted. Pt denies SI/HI/self harm thoughts as well as a/v hallucinations. Pt complains of abdominal discomfort this afternoon. Pt poor historian and is unable to recall last bowel movement. Bowel sounds sluggish, tenderness noted upon palpation in LLQ. Message sent to provider. Q 15 minute checks ongoing.

## 2023-01-07 NOTE — Progress Notes (Signed)
The focus of this group is to help patients review their daily goal of treatment and discuss progress on daily workbooks.  Pt attended the evening group and responded to all discussion prompts from the [employer]. Pt shared that today was a good day on the unit, the highlight of which was a visit from her mother.  When asked of her goal this week, Frank responded that she wanted to discharge home as she misses her dog. "I love my Durenda Hurt so much. I can't wait to see him."  Pt rated her day an 8 out of 10 and her affect was appropriate.

## 2023-01-08 ENCOUNTER — Encounter (HOSPITAL_COMMUNITY): Payer: Self-pay

## 2023-01-08 DIAGNOSIS — F23 Brief psychotic disorder: Secondary | ICD-10-CM | POA: Diagnosis not present

## 2023-01-08 LAB — CBC WITH DIFFERENTIAL/PLATELET
Abs Immature Granulocytes: 0.03 10*3/uL (ref 0.00–0.07)
Basophils Absolute: 0.1 10*3/uL (ref 0.0–0.1)
Basophils Relative: 1 %
Eosinophils Absolute: 0.1 10*3/uL (ref 0.0–0.5)
Eosinophils Relative: 1 %
HCT: 39.7 % (ref 36.0–46.0)
Hemoglobin: 13 g/dL (ref 12.0–15.0)
Immature Granulocytes: 0 %
Lymphocytes Relative: 18 %
Lymphs Abs: 1.7 10*3/uL (ref 0.7–4.0)
MCH: 30.9 pg (ref 26.0–34.0)
MCHC: 32.7 g/dL (ref 30.0–36.0)
MCV: 94.3 fL (ref 80.0–100.0)
Monocytes Absolute: 0.7 10*3/uL (ref 0.1–1.0)
Monocytes Relative: 7 %
Neutro Abs: 7.2 10*3/uL (ref 1.7–7.7)
Neutrophils Relative %: 73 %
Platelets: 318 10*3/uL (ref 150–400)
RBC: 4.21 MIL/uL (ref 3.87–5.11)
RDW: 13.5 % (ref 11.5–15.5)
WBC: 9.8 10*3/uL (ref 4.0–10.5)
nRBC: 0 % (ref 0.0–0.2)

## 2023-01-08 LAB — LITHIUM LEVEL: Lithium Lvl: 0.4 mmol/L — ABNORMAL LOW (ref 0.60–1.20)

## 2023-01-08 LAB — BASIC METABOLIC PANEL
Anion gap: 12 (ref 5–15)
BUN: 11 mg/dL (ref 6–20)
CO2: 22 mmol/L (ref 22–32)
Calcium: 9.5 mg/dL (ref 8.9–10.3)
Chloride: 105 mmol/L (ref 98–111)
Creatinine, Ser: 0.7 mg/dL (ref 0.44–1.00)
GFR, Estimated: >60 mL/min (ref >60)
Glucose, Bld: 97 mg/dL (ref 70–99)
Potassium: 3.6 mmol/L (ref 3.5–5.1)
Sodium: 139 mmol/L (ref 135–145)

## 2023-01-08 LAB — TSH: TSH: 1.624 u[IU]/mL (ref 0.350–4.500)

## 2023-01-08 LAB — VITAMIN B1: Vitamin B1 (Thiamine): 74.4 nmol/L (ref 66.5–200.0)

## 2023-01-08 MED ORDER — RISPERIDONE 1 MG PO TBDP
1.0000 mg | ORAL_TABLET | Freq: Every day | ORAL | Status: DC
  Filled 2023-01-08: qty 1

## 2023-01-08 MED ORDER — BENZTROPINE MESYLATE 0.5 MG PO TABS
0.5000 mg | ORAL_TABLET | Freq: Two times a day (BID) | ORAL | Status: DC
  Administered 2023-01-08 (×2): 0.5 mg via ORAL
  Filled 2023-01-08 (×6): qty 1

## 2023-01-08 MED ORDER — POLYETHYLENE GLYCOL 3350 17 G PO PACK
17.0000 g | PACK | Freq: Every day | ORAL | Status: DC
  Administered 2023-01-08 – 2023-01-18 (×11): 17 g via ORAL
  Filled 2023-01-08 (×14): qty 1

## 2023-01-08 MED ORDER — DOCUSATE SODIUM 100 MG PO CAPS
100.0000 mg | ORAL_CAPSULE | Freq: Two times a day (BID) | ORAL | Status: DC
  Administered 2023-01-08 – 2023-01-18 (×19): 100 mg via ORAL
  Filled 2023-01-08 (×24): qty 1

## 2023-01-08 MED ORDER — ATROPINE SULFATE 1 % OP SOLN
1.0000 [drp] | Freq: Three times a day (TID) | OPHTHALMIC | Status: DC
  Administered 2023-01-08 – 2023-01-14 (×19): 1 [drp] via SUBLINGUAL
  Filled 2023-01-08: qty 2

## 2023-01-08 NOTE — Progress Notes (Signed)
[facility] MD Progress Note  01/08/2023 12:30 PM Rebecca Hoover  MRN:  [id number]  Subjective:   Rebecca Hoover is a 22 y.o. female with a history of  Major depressive disorder, recurrent episode ([place]), GAD, speech impediment, SI , who was initially admitted for inpatient psychiatric hospitalization on 01/01/2023 for management of brief psychotic disorder.   On assessment today, patient is more somnolent, speech is less.,  Providing rational and logical responses.  Disorganized.  She is drooling.  She is able to make comments about her outside environment such as is a cold outside and is at the morning time. Per nursing, Risperdal morning dose was held due to sedation. On my exam, patient did have some mild stiffness in the upper extremities but no cogwheeling or tremor. She is unable to comment on having hallucinations or paranoia, nor is she able to comment logically on her mood or sleep. Nursing document she only slept 0.5 hours, but is unclear if this is accurate or not. She is still tachycardic and we will repeat the vital signs.  I did discuss with the patient and her lab results, which were significant for low lithium level and otherwise normal CBC and BMP.    Principal Problem: Brief psychotic disorder ([place]) Diagnosis: Principal Problem:   Brief psychotic disorder ([name]) Active Problems:   Suicidal ideation   Speech impediment   Generalized anxiety disorder   Major depressive disorder, recurrent episode ([name])  Total Time spent with patient: 15 minutes  Past Psychiatric History:  Previous Psych Diagnoses: Major Depressive Disorder Prior psychiatric treatment: None Psychiatric medication compliance history: Compliant   Current psychiatric treatment:  Lexapro 20 mg daily for MDD, started around spring of 2023 Current psychiatrist: Last saw [facility] 6 months ago Current therapist: Denies per mother   Previous hospitalizations Denies per mother History of suicide attempts: Denies per  mother History of self harm: Denies per mother  Past Medical History: No past medical history on file. No past surgical history on file. Family History: No family history on file.  Family Psychiatric  History:  Medical: Denies per mother Psych: 38 Sister-Bipolar Disorder Psych Rx: Unknown Suicide: Denies per mother Substance use family hx: Denies per mother  Social History:  Social History   Substance and Sexual Activity  Alcohol Use None     Social History   Substance and Sexual Activity  Drug Use Not on file    Social History   Socioeconomic History   Marital status: Widowed    Spouse name: Not on file   Number of children: Not on file   Years of education: Not on file   Highest education level: Not on file  Occupational History   Not on file  Tobacco Use   Smoking status: Unknown   Smokeless tobacco: Not on file  Substance and Sexual Activity   Alcohol use: Not on file   Drug use: Not on file   Sexual activity: Not on file  Other Topics Concern   Not on file  Social History Narrative   ** Merged History Encounter **       Social Determinants of Health   Financial Resource Strain: Not on file  Food Insecurity: Not on file  Transportation Needs: Not on file  Physical Activity: Not on file  Stress: Not on file  Social Connections: Not on file   Additional Social History:  Sleep: Unclear  Appetite:  unclear   Current Medications: Current Facility-Administered Medications  Medication Dose Route Frequency Provider Last Rate Last Admin   acetaminophen (TYLENOL) tablet 650 mg  650 mg Oral Q6H PRN Evette Georges, NP   650 mg at 01/06/23 2007   alum & mag hydroxide-simeth (MAALOX/MYLANTA) 200-200-20 MG/5ML suspension 30 mL  30 mL Oral Q4H PRN Evette Georges, NP       atropine 1 % ophthalmic solution 1 drop  1 drop Sublingual TID Janine Limbo, MD   1 drop at 01/08/23 1155   benztropine (COGENTIN) tablet 0.5 mg  0.5 mg  Oral BID Winfred Leeds, Nadir, MD   0.5 mg at 01/08/23 0820   haloperidol (HALDOL) tablet 5 mg  5 mg Oral TID PRN Janine Limbo, MD   5 mg at 01/04/23 0300   And   LORazepam (ATIVAN) tablet 2 mg  2 mg Oral TID PRN Janine Limbo, MD       And   diphenhydrAMINE (BENADRYL) capsule 50 mg  50 mg Oral TID PRN Janine Limbo, MD   50 mg at 01/04/23 0300   haloperidol lactate (HALDOL) injection 5 mg  5 mg Intramuscular TID PRN Janine Limbo, MD       And   LORazepam (ATIVAN) injection 2 mg  2 mg Intramuscular TID PRN Lisha Vitale, Ovid Curd, MD       And   diphenhydrAMINE (BENADRYL) injection 50 mg  50 mg Intramuscular TID PRN Gayna Braddy, Ovid Curd, MD       docusate sodium (COLACE) capsule 100 mg  100 mg Oral BID Eleena Grater, MD       feeding supplement (ENSURE ENLIVE / ENSURE PLUS) liquid 237 mL  237 mL Oral BID BM Nkwenti, Doris, NP   237 mL at 01/08/23 5366   hydrOXYzine (ATARAX) tablet 25 mg  25 mg Oral TID PRN Janine Limbo, MD   25 mg at 01/02/23 2355   lithium carbonate (LITHOBID) ER tablet 300 mg  300 mg Oral Q12H Coralyn Pear B, MD   300 mg at 01/08/23 0820   LORazepam (ATIVAN) tablet 1 mg  1 mg Oral Q6H PRN Martise Waddell, Ovid Curd, MD       magnesium hydroxide (MILK OF MAGNESIA) suspension 30 mL  30 mL Oral Daily PRN Evette Georges, NP   30 mL at 01/06/23 2007   norethindrone-ethinyl estradiol (LOESTRIN) 1-20 MG-MCG tablet 1 tablet  1 tablet Oral Daily Evette Georges, NP       polyethylene glycol (MIRALAX / GLYCOLAX) packet 17 g  17 g Oral Daily Damarion Mendizabal, MD   17 g at 01/08/23 1154   propranolol ER (INDERAL LA) 24 hr capsule 60 mg  60 mg Oral Daily Reva Pinkley, Ovid Curd, MD   60 mg at 01/08/23 0820    Lab Results:  Results for orders placed or performed during the hospital encounter of 01/01/23 (from the past 48 hour(s))  Lithium level     Status: Abnormal   Collection Time: 01/08/23  6:41 AM  Result Value Ref Range   Lithium Lvl 0.40 (L) 0.60 - 1.20 mmol/L     Comment: Performed at [facility], [place] [address], [place], [place] [address]  Basic metabolic panel     Status: None   Collection Time: 01/08/23  6:41 AM  Result Value Ref Range   Sodium 139 135 - 145 mmol/L   Potassium 3.6 3.5 - 5.1 mmol/L   Chloride 105 98 - 111 mmol/L   CO2 22 22 - 32 mmol/L  Glucose, Bld 97 70 - 99 mg/dL    Comment: Glucose reference range applies only to samples taken after fasting for at least 8 hours.   BUN 11 6 - 20 mg/dL   Creatinine, Ser 0.70 0.44 - 1.00 mg/dL   Calcium 9.5 8.9 - 10.3 mg/dL   GFR, Estimated >60 >60 mL/min    Comment: (NOTE) Calculated using the CKD-EPI Creatinine Equation (2021)    Anion gap 12 5 - 15    Comment: Performed at [facility], [place] [address] [place], [place] [address]  CBC with Differential/Platelet     Status: None   Collection Time: 01/08/23  6:41 AM  Result Value Ref Range   WBC 9.8 4.0 - 10.5 K/uL   RBC 4.21 3.87 - 5.11 MIL/uL   Hemoglobin 13.0 12.0 - 15.0 g/dL   HCT 39.7 36.0 - 46.0 %   MCV 94.3 80.0 - 100.0 fL   MCH 30.9 26.0 - 34.0 pg   MCHC 32.7 30.0 - 36.0 g/dL   RDW 13.5 11.5 - 15.5 %   Platelets 318 150 - 400 K/uL   nRBC 0.0 0.0 - 0.2 %   Neutrophils Relative % 73 %   Neutro Abs 7.2 1.7 - 7.7 K/uL   Lymphocytes Relative 18 %   Lymphs Abs 1.7 0.7 - 4.0 K/uL   Monocytes Relative 7 %   Monocytes Absolute 0.7 0.1 - 1.0 K/uL   Eosinophils Relative 1 %   Eosinophils Absolute 0.1 0.0 - 0.5 K/uL   Basophils Relative 1 %   Basophils Absolute 0.1 0.0 - 0.1 K/uL   Immature Granulocytes 0 %   Abs Immature Granulocytes 0.03 0.00 - 0.07 K/uL    Comment: Performed at [facility], [place] [address], [place], [place] [address]  TSH     Status: None   Collection Time: 01/08/23  6:41 AM  Result Value Ref Range   TSH 1.624 0.350 - 4.500 uIU/mL    Comment: Performed by a 3rd Generation assay with a functional sensitivity of <=0.01 uIU/mL. Performed at [facility], [place] [address], [place], [place] [address]     Blood Alcohol level:  Lab Results  Component Value Date   ETH <10 89/21/1940    Metabolic Disorder Labs: Lab Results  Component Value Date   HGBA1C 4.8 01/02/2023   MPG 91.06 01/02/2023   MPG 88.19 12/29/2022   No results found for: "PROLACTIN" Lab Results  Component Value Date   CHOL 128 01/02/2023   TRIG 34 01/02/2023   HDL 54 01/02/2023   CHOLHDL 2.4 01/02/2023   VLDL 7 01/02/2023   LDLCALC 67 01/02/2023   LDLCALC 88 12/29/2022    Physical Findings: AIMS: Facial and Oral Movements Muscles of Facial Expression: None, normal Lips and Perioral Area: None, normal Jaw: None, normal Tongue: None, normal,Extremity Movements Upper (arms, wrists, hands, fingers): None, normal Lower (legs, knees, ankles, toes): None, normal, Trunk Movements Neck, shoulders, hips: None, normal, Overall Severity Severity of abnormal movements (highest score from questions above): None, normal Incapacitation due to abnormal movements: None, normal Patient's awareness of abnormal movements (rate only patient's report): No Awareness, Dental Status Current problems with teeth and/or dentures?: No Does patient usually wear dentures?: No  CIWA:    COWS:     Musculoskeletal: Strength & Muscle Tone: Some stiffness of bilateral upper extremities no tremor or cogwheeling Gait & Station: Did not observe her gait today Patient leans: N/A  Psychiatric Specialty Exam:  Presentation  General Appearance:  Bizarre; Disheveled  Eye Contact: Minimal  Speech: Slurred; Garbled  Speech Volume: Decreased  Handedness: Right   Mood and Affect  Mood: Dysphoric  Affect: Constricted; Restricted   Thought Process  Thought Processes: Disorganized  Descriptions of Associations:Tangential  Orientation:Full (Time, Place and Person)  Thought Content:Delusions; Illogical; Perseveration  History of  Schizophrenia/Schizoaffective disorder:Yes  Duration of Psychotic Symptoms:Greater than six months  Hallucinations:No data recorded did not report any AH or VH Ideas of Reference: Unclear, patient had previously reported paranoia and delusions  Suicidal Thoughts:No data recorded denies Homicidal Thoughts:No data recorded denies  Sensorium  Memory: Remote Fair; Immediate Fair; Recent Fair  Judgment: Poor  Insight: Poor   Executive Functions  Concentration: Poor  Attention Span: Poor   Psychomotor Activity  Psychomotor Activity:No data recorded Some stiffness of upper extremities, no tremor or cogwheeling  Assets  Assets: Desire for Improvement; Resilience; Leisure Time; Social Support   Sleep  Sleep:No data recorded Unclear, documented 0.5 hours last night  Physical Exam: Physical Exam Vitals reviewed.  Constitutional:      Appearance: She is normal weight. She is ill-appearing.  Neurological:     Mental Status: She is alert.     Motor: Weakness present.    Review of Systems  Psychiatric/Behavioral:         Psychotic   Blood pressure 119/82, pulse (!) 128, temperature (!) 97.3 F (36.3 C), temperature source Oral, resp. rate 16, height '5\' 4"'$  (1.626 m), weight 70.3 kg, SpO2 98 %. Body mass index is 26.61 kg/m.     Treatment Plan Summary: Daily contact with patient to assess and evaluate symptoms and progress in treatment and medication management   ASSESSMENT: Brief Psychotic Disorder  History of MDD  Pleshette Tomasini is a 22 y.o., female with a past psychiatric history significant for Major depressive disorder, recurrent episode ([place]), GAD, speech impediment, SI who presents to the [facility] Involuntary from behavioral health urgent care [facility]) for evaluation and management of psychosis concerning for brief psychotic disorder with AVH.   PLAN: Safety and Monitoring:             -- Involuntary admission to inpatient psychiatric  unit for safety, stabilization and treatment             -- Daily contact with patient to assess and evaluate symptoms and progress in treatment             -- Patient's case to be discussed in multi-disciplinary team meeting             -- Observation Level : q15 minute checks             -- Vital signs:  q12 hours             -- Precautions: suicide, elopement, and assault   2. Medications:               Psychiatric Diagnosis and Treatment   Brief Psychotic Disorder  History of MDD  - Dc risperdal from now (was taking 1 mg qam, 1 mg at noon, and 2 mg qhs) Patient is having significant sedation, has drooling, and has some mild rigidity of upper extremities.  It is unclear whether the patient's sedation came on rapidly -yesterday was more alert and speaking more clearly. -Continue lithium 300 mg every 12 hours for now -Previously discontinued Ativan -Previously tapered off Lexapro -DC trazodone 50 mg qhs prn    Agitation Protocol Benadryl 50  mg TID IV and oral as needed for agitation Haldol 5 mg TID IV and oral as needed for agitation Ativan 2 mg TID IM and oral as needed for agitation   Patient does not need nicotine replacement   Medical Diagnosis and Treatment   Abnormal Urine Analysis - UA from the ED positive for large leukocytes, rare bacteria.  - Will repeat due to concern for UTI  Concern for constiaption -start scheduled colace 100 mg bid  -start miralax 17g daily   Sialorrhea -  -start cogentin 0.5 mg bid -start SL atropine drops    Other as needed medications  Tylenol 650 mg every 6 hours as needed for pain Mylanta 30 mL every 4 hours as needed for indigestion Milk of magnesia 30 mL daily as needed for constipation   3. Routine and other pertinent labs: EKG monitoring: QTc: 444   Metabolism / endocrine: BMI: Body mass index is 26.61 kg/m.    4. Group Therapy:             -- Encouraged patient to participate in unit milieu and in scheduled group  therapies             -- We will address other chronic and acute stressors, which contributed to the patient's Brief psychotic disorder ([place]) in order to reduce the risk of self-harm at discharge.   5. Discharge Planning:              -- Social work and case management to assist with discharge planning and identification of hospital follow-up needs prior to discharge             -- Estimated LOS: 5-7 days             -- Discharge Concerns: Need to establish a safety plan; Medication compliance and effectiveness             -- Discharge Goals: Return home with outpatient referrals for mental health follow-up including medication management/psychotherapy   I certify that inpatient services furnished can reasonably be expected to improve the patient's condition.    Christoper Allegra, MD 01/08/2023, 12:30 PM  Total Time Spent in Direct Patient Care:  I personally spent 40 minutes on the unit in direct patient care. The direct patient care time included face-to-face time with the patient, reviewing the patient's chart, communicating with other professionals, and coordinating care. Greater than 50% of this time was spent in counseling or coordinating care with the patient regarding goals of hospitalization, psycho-education, and discharge planning needs.   Janine Limbo, MD Psychiatrist

## 2023-01-08 NOTE — Group Note (Signed)
Recreation Therapy Group Note   Group Topic:Communication  Group Date: 01/08/2023 Start Time: 1000 End Time: 1035 Facilitators: Zameria Vogl-McCall, LRT,CTRS Location: [address] Dayroom   Goal Area(s) Addresses:  Patient will effectively listen to complete activity.  Patient will identify communication skills used to make activity successful.  Patient will identify how skills used during activity can be used to reach post d/c goals.    Group Description:  Geometric Drawings.  Three volunteers from the peer group will be shown an abstract picture with a particular arrangement of geometrical shapes.  Each round, one 'speaker' will describe the pattern, as accurately as possible without revealing the image to the group.  The remaining group members will listen and draw the picture to reflect how it is described to them. Patients with the role of 'listener' cannot ask clarifying questions but, may request that the speaker repeat a direction. Once the drawings are complete, the presenter will show the rest of the group the picture and compare how close each person came to drawing the picture. LRT will facilitate a post-activity discussion regarding effective communication and the importance of planning, listening, and asking for clarification in daily interactions with others.   Affect/Mood: N/A   Participation Level: Did not attend    Clinical Observations/Individualized Feedback:     Plan: Continue to engage patient in RT group sessions 2-3x/week.   Chairty Toman-McCall, LRT,CTRS 01/08/2023 12:36 PM

## 2023-01-08 NOTE — Progress Notes (Signed)
   01/08/23 0656  15 Minute Checks  Location Hallway  Visual Appearance Calm  Behavior Composed  Sleep (Behavioral Health Patients Only)  Calculate sleep? (Click Yes once per 24 hr at 0600 safety check) Yes  Documented sleep last 24 hours .5

## 2023-01-08 NOTE — Progress Notes (Signed)
Patient did not attend morning orientation group.  

## 2023-01-08 NOTE — Care Management (Signed)
I spoke with mother [name] via telephone just now - we discussed patient's progression, medication treatment, and pt's sedation last night and this morning. We discussed medication changes being made to address this today, in addition to drooling. Mother had no other questions.

## 2023-01-08 NOTE — Progress Notes (Signed)
Pt woke up drooling excessively and appeared to have bitten her lip lastnight. She has small laceration on bottom lip. No bleeding visible. Pt alert and following commands but is not verbally talking to staff, just nodding her head. She is walking with steady gait She has baseline speech impairment. Vitals WDL.   Providers paged to make aware. AC and charge nurse notified. Awaiting Response.

## 2023-01-08 NOTE — Progress Notes (Signed)
   01/07/23 2055  Psych Admission Type (Psych Patients Only)  Admission Status Involuntary  Psychosocial Assessment  Patient Complaints Anxiety  Eye Contact Fair  Facial Expression Anxious  Affect Appropriate to circumstance  Speech Incoherent  Interaction Cautious  Motor Activity Slow  Appearance/Hygiene Unremarkable  Behavior Characteristics Cooperative;Anxious  Mood Pleasant  Thought Process  Coherency Disorganized  Content Blaming self  Delusions None reported or observed  Perception WDL  Hallucination None reported or observed  Judgment Poor  Confusion Mild  Danger to Self  Current suicidal ideation? Denies  Danger to Others  Danger to Others None reported or observed

## 2023-01-08 NOTE — Progress Notes (Addendum)
D: Patient is alert, drooling, and cooperative. She is having trouble talking due to drooling. She does not appear to be responding to internal stimuli. Denies SI HI.    A: Scheduled medications administered per MD order. Emotional support and availability provided. Patient educated on safety on the unit and medications. Routine safety checks every 15 minutes.    R: Patient remains safe at this time and will continue to monitor.    01/08/23 0900  Psych Admission Type (Psych Patients Only)  Admission Status Involuntary  Psychosocial Assessment  Patient Complaints None  Eye Contact Brief  Facial Expression Blank  Affect Appropriate to circumstance  Speech Incoherent  Interaction Cautious  Motor Activity Slow  Appearance/Hygiene Disheveled  Behavior Characteristics Cooperative  Mood Pleasant;Helpless  Thought Process  Coherency Disorganized  Content UTA (pt having trouble speaking d/t drooling)  Delusions None reported or observed  Perception WDL  Hallucination None reported or observed  Judgment Poor  Confusion Mild  Danger to Self  Current suicidal ideation? Denies  Danger to Others  Danger to Others None reported or observed

## 2023-01-08 NOTE — Progress Notes (Signed)
The patient rated her day as a 10 out of 10 but offered very few details about her day.

## 2023-01-08 NOTE — BH IP Treatment Plan (Signed)
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[employer])  Secondary Diagnoses: Principal Problem:   Brief psychotic disorder ([place]) Active Problems:   Suicidal ideation   Speech impediment   Generalized anxiety disorder   Major depressive disorder, recurrent episode ([place])   Current Medications:  Current Facility-Administered Medications  Medication Dose Route Frequency Provider Last Rate Last Admin   acetaminophen (TYLENOL) tablet 650 mg  650 mg Oral Q6H PRN Evette Georges, NP   650 mg at 01/06/23 2007   alum & mag hydroxide-simeth (MAALOX/MYLANTA) 200-200-20 MG/5ML suspension 30 mL  30 mL Oral Q4H PRN Evette Georges, NP       haloperidol (HALDOL) tablet 5 mg  5 mg Oral TID PRN Janine Limbo, MD   5 mg at 01/04/23 0300   And   LORazepam (ATIVAN) tablet 2 mg  2 mg Oral TID PRN Janine Limbo, MD       And   diphenhydrAMINE (BENADRYL) capsule 50 mg  50 mg Oral TID PRN Janine Limbo, MD   50 mg at 01/04/23 0300   haloperidol lactate (HALDOL) injection 5 mg  5 mg Intramuscular TID PRN Massengill, Ovid Curd, MD       And   LORazepam (ATIVAN) injection 2 mg  2 mg Intramuscular TID PRN Massengill, Ovid Curd, MD       And   diphenhydrAMINE (BENADRYL) injection 50 mg  50 mg Intramuscular TID PRN Massengill, Ovid Curd, MD       feeding supplement (ENSURE ENLIVE / ENSURE PLUS) liquid 237 mL  237 mL Oral BID BM Nkwenti, Doris, NP   237 mL at 01/07/23 1500   hydrOXYzine (ATARAX) tablet 25 mg  25 mg Oral TID PRN Janine Limbo, MD   25 mg at 01/02/23 2355   lithium carbonate (LITHOBID) ER tablet 300 mg  300 mg Oral Q12H Coralyn Pear B, MD   300 mg at 01/07/23 2055   LORazepam (ATIVAN) tablet 1 mg  1 mg Oral Q6H PRN Massengill, Ovid Curd, MD       magnesium hydroxide (MILK OF MAGNESIA) suspension 30 mL  30 mL Oral Daily PRN Evette Georges, NP   30 mL at 01/06/23 2007    norethindrone-ethinyl estradiol (LOESTRIN) 1-20 MG-MCG tablet 1 tablet  1 tablet Oral Daily Evette Georges, NP       propranolol ER (INDERAL LA) 24 hr capsule 60 mg  60 mg Oral Daily Massengill, Nathan, MD       risperiDONE (RISPERDAL M-TABS) disintegrating tablet 1 mg  1 mg Oral BID Coralyn Pear B, MD   1 mg at 01/07/23 1522   risperiDONE (RISPERDAL M-TABS) disintegrating tablet 2 mg  2 mg Oral QHS Massengill, Nathan, MD   2 mg at 01/07/23 2055   traZODone (DESYREL) tablet 50 mg  50 mg Oral QHS PRN Massengill, Ovid Curd, MD   50 mg at 01/02/23 2050   PTA Medications: Medications Prior to Admission  Medication Sig Dispense Refill Last Dose   norethindrone-ethinyl estradiol (LOESTRIN) 1-20 MG-MCG tablet Take 1 tablet by mouth daily. (Patient not taking: Reported on 12/30/2022)       Patient Stressors:    Patient Strengths:    Treatment Modalities: Medication Management, Group therapy, Case management,  1 to 1 session with clinician, Psychoeducation, Recreational therapy.   Physician Treatment Plan for Primary Diagnosis: Brief psychotic disorder [facility]) Long Term Goal(s): Improvement in symptoms so as ready for discharge   Short Term  Goals: Ability to identify changes in lifestyle to reduce recurrence of condition will improve Ability to verbalize feelings will improve Ability to disclose and discuss suicidal ideas Ability to identify and develop effective coping behaviors will improve  Medication Management: Evaluate patient's response, side effects, and tolerance of medication regimen.  Therapeutic Interventions: 1 to 1 sessions, Unit Group sessions and Medication administration.  Evaluation of Outcomes: Progressing  Physician Treatment Plan for Secondary Diagnosis: Principal Problem:   Brief psychotic disorder ([place]) Active Problems:   Suicidal ideation   Speech impediment   Generalized anxiety disorder   Major depressive disorder, recurrent episode ([place])  Long Term Goal(s):  Improvement in symptoms so as ready for discharge   Short Term Goals: Ability to identify changes in lifestyle to reduce recurrence of condition will improve Ability to verbalize feelings will improve Ability to disclose and discuss suicidal ideas Ability to identify and develop effective coping behaviors will improve     Medication Management: Evaluate patient's response, side effects, and tolerance of medication regimen.  Therapeutic Interventions: 1 to 1 sessions, Unit Group sessions and Medication administration.  Evaluation of Outcomes: Progressing   RN Treatment Plan for Primary Diagnosis: Brief psychotic disorder ([place]) Long Term Goal(s): Knowledge of disease and therapeutic regimen to maintain health will improve  Short Term Goals: Ability to remain free from injury will improve, Ability to verbalize frustration and anger appropriately will improve, Ability to demonstrate self-control, Ability to participate in decision making will improve, Ability to verbalize feelings will improve, and Compliance with prescribed medications will improve  Medication Management: RN will administer medications as ordered by provider, will assess and evaluate patient's response and provide education to patient for prescribed medication. RN will report any adverse and/or side effects to prescribing provider.  Therapeutic Interventions: 1 on 1 counseling sessions, Psychoeducation, Medication administration, Evaluate responses to treatment, Monitor vital signs and CBGs as ordered, Perform/monitor CIWA, COWS, AIMS and Fall Risk screenings as ordered, Perform wound care treatments as ordered.  Evaluation of Outcomes: Progressing   LCSW Treatment Plan for Primary Diagnosis: Brief psychotic disorder [facility]) Long Term Goal(s): Safe transition to appropriate next level of care at discharge, Engage patient in therapeutic group addressing interpersonal concerns.  Short Term Goals: Engage patient in aftercare  planning with referrals and resources, Increase social support, Increase emotional regulation, Facilitate acceptance of mental health diagnosis and concerns, Identify triggers associated with mental health/substance abuse issues, and Increase skills for wellness and recovery  Therapeutic Interventions: Assess for all discharge needs, 1 to 1 time with Social worker, Explore available resources and support systems, Assess for adequacy in community support network, Educate family and significant other(s) on suicide prevention, Complete Psychosocial Assessment, Interpersonal group therapy.  Evaluation of Outcomes: Progressing  Progress in Treatment: Attending groups: Yes. Participating in groups: Yes. Taking medication as prescribed: Yes. Toleration medication: Yes. Family/Significant other contact made: Yes, individual(s) contacted:  Mother  Patient understands diagnosis: No. Discussing patient identified problems/goals with staff: Yes. Medical problems stabilized or resolved: Yes. Denies suicidal/homicidal ideation: Yes. Issues/concerns per patient self-inventory: No.     New problem(s) identified: No, Describe:  None reported    New Short Term/Long Term Goal(s): medication stabilization, elimination of SI thoughts, development of comprehensive mental wellness plan.    Patient Goals: "I want to go back to school"    Discharge Plan or Barriers: Patient recently admitted. CSW will continue to follow and assess for appropriate referrals and possible discharge planning.    Reason for Continuation of Hospitalization: Anxiety  Delusions  Hallucinations Medication stabilization   Estimated Length of Stay: 3 to 7 days   Last 3 [place] Suicide Severity Risk Score: [place] ED from 12/31/2022 in [facility] Emergency Department at [facility] ED from 12/29/2022 in [facility] ED from 01/20/2022 in [facility] Emergency Department at [place] No Risk No Risk No Risk       Last [facility] 2/9 Scores:     No data to display          Scribe for Treatment Team: Charlett Lango 01/08/2023 7:53 AM

## 2023-01-09 DIAGNOSIS — F23 Brief psychotic disorder: Secondary | ICD-10-CM | POA: Diagnosis not present

## 2023-01-09 MED ORDER — LORAZEPAM 0.5 MG PO TABS: 0.5000 mg | ORAL_TABLET | Freq: Two times a day (BID) | ORAL | Status: DC

## 2023-01-09 MED ORDER — LORAZEPAM 0.5 MG PO TABS: 0.2500 mg | ORAL_TABLET | Freq: Three times a day (TID) | ORAL | Status: DC

## 2023-01-09 MED ORDER — LORAZEPAM 2 MG/ML IJ SOLN: 1.0000 mg | Freq: Four times a day (QID) | INTRAMUSCULAR | Status: DC | PRN

## 2023-01-09 MED ORDER — LORAZEPAM 0.5 MG PO TABS: 0.2500 mg | ORAL_TABLET | Freq: Two times a day (BID) | ORAL | Status: DC

## 2023-01-09 MED ORDER — TRAZODONE HCL 50 MG PO TABS: 50.0000 mg | ORAL_TABLET | Freq: Every evening | ORAL | Status: DC | PRN

## 2023-01-09 MED ORDER — RISPERIDONE 0.5 MG PO TBDP
0.5000 mg | ORAL_TABLET | Freq: Three times a day (TID) | ORAL | Status: DC
  Administered 2023-01-09: 0.5 mg via ORAL
  Filled 2023-01-09 (×11): qty 1

## 2023-01-09 MED ORDER — RISPERIDONE 0.5 MG PO TBDP
0.5000 mg | ORAL_TABLET | Freq: Two times a day (BID) | ORAL | Status: DC
  Filled 2023-01-09 (×3): qty 1

## 2023-01-09 MED ORDER — LORAZEPAM 1 MG PO TABS: 1.0000 mg | ORAL_TABLET | Freq: Four times a day (QID) | ORAL | Status: DC | PRN

## 2023-01-09 MED ORDER — RISPERIDONE 1 MG/ML PO SOLN
0.5000 mg | Freq: Three times a day (TID) | ORAL | Status: DC
  Administered 2023-01-09 – 2023-01-10 (×2): 0.5 mg via ORAL
  Filled 2023-01-09 (×9): qty 0.5

## 2023-01-09 MED ORDER — RISPERIDONE 1 MG PO TBDP
1.0000 mg | ORAL_TABLET | Freq: Every day | ORAL | Status: DC
  Filled 2023-01-09: qty 1

## 2023-01-09 MED ORDER — BENZTROPINE MESYLATE 1 MG/ML IJ SOLN
1.0000 mg | Freq: Two times a day (BID) | INTRAMUSCULAR | Status: DC
  Administered 2023-01-09: 1 mg via INTRAMUSCULAR
  Filled 2023-01-09 (×4): qty 1
  Filled 2023-01-09: qty 2
  Filled 2023-01-09 (×3): qty 1

## 2023-01-09 MED ORDER — LORAZEPAM 2 MG/ML IJ SOLN: 1.0000 mg | Freq: Three times a day (TID) | INTRAMUSCULAR | Status: DC

## 2023-01-09 MED ORDER — TEMAZEPAM 15 MG PO CAPS: 15.0000 mg | ORAL_CAPSULE | Freq: Every evening | ORAL | Status: DC | PRN

## 2023-01-09 MED ORDER — BENZTROPINE MESYLATE 1 MG PO TABS
1.0000 mg | ORAL_TABLET | Freq: Two times a day (BID) | ORAL | Status: DC
  Administered 2023-01-09 – 2023-01-10 (×2): 1 mg via ORAL
  Filled 2023-01-09 (×7): qty 1

## 2023-01-09 MED ORDER — LORAZEPAM 2 MG/ML IJ SOLN
1.0000 mg | Freq: Once | INTRAMUSCULAR | Status: AC
  Administered 2023-01-09: 1 mg via INTRAMUSCULAR
  Filled 2023-01-09: qty 1

## 2023-01-09 MED ORDER — LORAZEPAM 1 MG PO TABS
1.0000 mg | ORAL_TABLET | Freq: Three times a day (TID) | ORAL | Status: DC
  Administered 2023-01-09 – 2023-01-10 (×3): 1 mg via ORAL
  Filled 2023-01-09 (×4): qty 1

## 2023-01-09 NOTE — Progress Notes (Signed)
[facility] MD Progress Note  01/09/2023 11:46 AM Rebecca Hoover  MRN:  [id number]  Subjective:   Rebecca Hoover is a 22 y.o. female with a history of  Major depressive disorder, recurrent episode ([place]), GAD, speech impediment, SI , who was initially admitted for inpatient psychiatric hospitalization on 01/01/2023 for management of brief psychotic disorder.   Patient was interviewed twice today. On initial assessment around 815am, the pt is clearly catatonic. Mostly nonverbal, not interacting with outside world, not moving. Sitting upright. No rigidity. Per nurse, not eating, not knowing to swallow.  Per nurse only 2.5 hours sleep overnight, up and down.  We gave 1 mg ativan IM, and was interviewed thereafter - pt was able to speak, have gaze in direction of conversation, move extremities and respond to commands. This is a positive ativan challenge. Last dose of ativan was Sunday after taper, and symptoms of catatonia were first appreciated Monday, worsening through Tuesday (attributed to too much risperdal dose, but Risperdal stopped yesterday and pt had worsening symptoms of what is now recognized to be catatonia).  We will start ativan IM, and change other meds to IM or liquid, as able.  Psychosis still present but due to limited speech, it is difficult to assess.     Principal Problem: Brief psychotic disorder ([place]) Diagnosis: Principal Problem:   Brief psychotic disorder ([place]) Active Problems:   Suicidal ideation   Speech impediment   Generalized anxiety disorder   Major depressive disorder, recurrent episode ([place])  Total Time spent with patient: 25 minutes  Past Psychiatric History:  Previous Psych Diagnoses: Major Depressive Disorder Prior psychiatric treatment: None Psychiatric medication compliance history: Compliant   Current psychiatric treatment:  Lexapro 20 mg daily for MDD, started around spring of 2023 Current psychiatrist: Last saw [facility] 6 months ago Current therapist:  Denies per mother   Previous hospitalizations Denies per mother History of suicide attempts: Denies per mother History of self harm: Denies per mother  Past Medical History: No past medical history on file. No past surgical history on file. Family History: No family history on file.  Family Psychiatric  History:  Medical: Denies per mother Psych: 20 Sister-Bipolar Disorder Psych Rx: Unknown Suicide: Denies per mother Substance use family hx: Denies per mother  Social History:  Social History   Substance and Sexual Activity  Alcohol Use None     Social History   Substance and Sexual Activity  Drug Use Not on file    Social History   Socioeconomic History   Marital status: Widowed    Spouse name: Not on file   Number of children: Not on file   Years of education: Not on file   Highest education level: Not on file  Occupational History   Not on file  Tobacco Use   Smoking status: Unknown   Smokeless tobacco: Not on file  Substance and Sexual Activity   Alcohol use: Not on file   Drug use: Not on file   Sexual activity: Not on file  Other Topics Concern   Not on file  Social History Narrative   ** Merged History Encounter **       Social Determinants of Health   Financial Resource Strain: Not on file  Food Insecurity: Not on file  Transportation Needs: Not on file  Physical Activity: Not on file  Stress: Not on file  Social Connections: Not on file   Additional Social History:  Sleep: poor  Appetite:  poor   Current Medications: Current Facility-Administered Medications  Medication Dose Route Frequency Provider Last Rate Last Admin   acetaminophen (TYLENOL) tablet 650 mg  650 mg Oral Q6H PRN Evette Georges, NP   650 mg at 01/06/23 2007   alum & mag hydroxide-simeth (MAALOX/MYLANTA) 200-200-20 MG/5ML suspension 30 mL  30 mL Oral Q4H PRN Evette Georges, NP       atropine 1 % ophthalmic solution 1 drop  1 drop Sublingual  TID Janine Limbo, MD   1 drop at 01/09/23 1059   benztropine (COGENTIN) tablet 1 mg  1 mg Oral BID Brittiney Dicostanzo, Ovid Curd, MD       Or   benztropine mesylate (COGENTIN) injection 1 mg  1 mg Intramuscular BID Milos Milligan, Ovid Curd, MD   1 mg at 01/09/23 1059   haloperidol (HALDOL) tablet 5 mg  5 mg Oral TID PRN Janine Limbo, MD   5 mg at 01/04/23 0300   And   LORazepam (ATIVAN) tablet 2 mg  2 mg Oral TID PRN Janine Limbo, MD       And   diphenhydrAMINE (BENADRYL) capsule 50 mg  50 mg Oral TID PRN Janine Limbo, MD   50 mg at 01/04/23 0300   haloperidol lactate (HALDOL) injection 5 mg  5 mg Intramuscular TID PRN Janine Limbo, MD       And   LORazepam (ATIVAN) injection 2 mg  2 mg Intramuscular TID PRN Janine Limbo, MD       And   diphenhydrAMINE (BENADRYL) injection 50 mg  50 mg Intramuscular TID PRN Delvina Mizzell, Ovid Curd, MD       docusate sodium (COLACE) capsule 100 mg  100 mg Oral BID Myliah Medel, Ovid Curd, MD   100 mg at 01/09/23 1058   feeding supplement (ENSURE ENLIVE / ENSURE PLUS) liquid 237 mL  237 mL Oral BID BM Nicholes Rough, NP   237 mL at 01/09/23 1059   hydrOXYzine (ATARAX) tablet 25 mg  25 mg Oral TID PRN Janine Limbo, MD   25 mg at 01/02/23 2355   lithium carbonate (LITHOBID) ER tablet 300 mg  300 mg Oral Q12H Coralyn Pear B, MD   300 mg at 01/09/23 1058   LORazepam (ATIVAN) tablet 1 mg  1 mg Oral Q8H Ameir Faria, MD       Or   LORazepam (ATIVAN) injection 1 mg  1 mg Intramuscular Q8H Saad Buhl, MD       LORazepam (ATIVAN) tablet 1 mg  1 mg Oral Q6H PRN Dontravious Camille, Ovid Curd, MD       Or   LORazepam (ATIVAN) injection 1 mg  1 mg Intramuscular Q6H PRN Marilena Trevathan, MD       magnesium hydroxide (MILK OF MAGNESIA) suspension 30 mL  30 mL Oral Daily PRN Evette Georges, NP   30 mL at 01/06/23 2007   norethindrone-ethinyl estradiol (LOESTRIN) 1-20 MG-MCG tablet 1 tablet  1 tablet Oral Daily Evette Georges, NP       polyethylene glycol  (MIRALAX / GLYCOLAX) packet 17 g  17 g Oral Daily Haidyn Kilburg, MD   17 g at 01/09/23 1100   propranolol ER (INDERAL LA) 24 hr capsule 60 mg  60 mg Oral Daily Dafne Nield, Ovid Curd, MD   60 mg at 01/09/23 1058   risperiDONE (RISPERDAL M-TABS) disintegrating tablet 0.5 mg  0.5 mg Oral Q8H Sherena Machorro, MD       Or   risperiDONE (RISPERDAL) 1 MG/ML oral solution 0.5 mg  0.5 mg  Oral Q8H Aleksia Freiman, MD       temazepam (RESTORIL) capsule 15 mg  15 mg Oral QHS PRN Bookert Guzzi, Ovid Curd, MD       traZODone (DESYREL) tablet 50 mg  50 mg Oral QHS PRN Ahnyla Mendel, Ovid Curd, MD        Lab Results:  Results for orders placed or performed during the hospital encounter of 01/01/23 (from the past 48 hour(s))  Lithium level     Status: Abnormal   Collection Time: 01/08/23  6:41 AM  Result Value Ref Range   Lithium Lvl 0.40 (L) 0.60 - 1.20 mmol/L    Comment: Performed at [facility], [place] [address], [place], [place] [address]  Basic metabolic panel     Status: None   Collection Time: 01/08/23  6:41 AM  Result Value Ref Range   Sodium 139 135 - 145 mmol/L   Potassium 3.6 3.5 - 5.1 mmol/L   Chloride 105 98 - 111 mmol/L   CO2 22 22 - 32 mmol/L   Glucose, Bld 97 70 - 99 mg/dL    Comment: Glucose reference range applies only to samples taken after fasting for at least 8 hours.   BUN 11 6 - 20 mg/dL   Creatinine, Ser 0.70 0.44 - 1.00 mg/dL   Calcium 9.5 8.9 - 10.3 mg/dL   GFR, Estimated >60 >60 mL/min    Comment: (NOTE) Calculated using the CKD-EPI Creatinine Equation (2021)    Anion gap 12 5 - 15    Comment: Performed at [facility], [place] [address], [place], [place] [address]  CBC with Differential/Platelet     Status: None   Collection Time: 01/08/23  6:41 AM  Result Value Ref Range   WBC 9.8 4.0 - 10.5 K/uL   RBC 4.21 3.87 - 5.11 MIL/uL   Hemoglobin 13.0 12.0 - 15.0 g/dL   HCT 39.7 36.0 - 46.0 %   MCV 94.3 80.0 - 100.0 fL   MCH 30.9 26.0 - 34.0  pg   MCHC 32.7 30.0 - 36.0 g/dL   RDW 13.5 11.5 - 15.5 %   Platelets 318 150 - 400 K/uL   nRBC 0.0 0.0 - 0.2 %   Neutrophils Relative % 73 %   Neutro Abs 7.2 1.7 - 7.7 K/uL   Lymphocytes Relative 18 %   Lymphs Abs 1.7 0.7 - 4.0 K/uL   Monocytes Relative 7 %   Monocytes Absolute 0.7 0.1 - 1.0 K/uL   Eosinophils Relative 1 %   Eosinophils Absolute 0.1 0.0 - 0.5 K/uL   Basophils Relative 1 %   Basophils Absolute 0.1 0.0 - 0.1 K/uL   Immature Granulocytes 0 %   Abs Immature Granulocytes 0.03 0.00 - 0.07 K/uL    Comment: Performed at [facility], [place] [address] [place], [place] [address]  TSH     Status: None   Collection Time: 01/08/23  6:41 AM  Result Value Ref Range   TSH 1.624 0.350 - 4.500 uIU/mL    Comment: Performed by a 3rd Generation assay with a functional sensitivity of <=0.01 uIU/mL. Performed at [facility], [place] [address], [place],  [address]     Blood Alcohol level:  Lab Results  Component Value Date   ETH <10 23/53/6144    Metabolic Disorder Labs: Lab Results  Component Value Date   HGBA1C 4.8 01/02/2023   MPG 91.06 01/02/2023   MPG 88.19 12/29/2022   No results found for: "PROLACTIN" Lab  Results  Component Value Date   CHOL 128 01/02/2023   TRIG 34 01/02/2023   HDL 54 01/02/2023   CHOLHDL 2.4 01/02/2023   VLDL 7 01/02/2023   LDLCALC 67 01/02/2023   LDLCALC 88 12/29/2022    Physical Findings: AIMS: Facial and Oral Movements Muscles of Facial Expression: None, normal Lips and Perioral Area: None, normal Jaw: None, normal Tongue: None, normal,Extremity Movements Upper (arms, wrists, hands, fingers): None, normal Lower (legs, knees, ankles, toes): None, normal, Trunk Movements Neck, shoulders, hips: None, normal, Overall Severity Severity of abnormal movements (highest score from questions above): None, normal Incapacitation due to abnormal movements: None, normal Patient's awareness of abnormal  movements (rate only patient's report): No Awareness, Dental Status Current problems with teeth and/or dentures?: No Does patient usually wear dentures?: No  CIWA:    COWS:     Musculoskeletal: Strength & Muscle Tone: Some stiffness of bilateral upper extremities no tremor or cogwheeling Gait & Station: Did not observe her gait today Patient leans: N/A  Psychiatric Specialty Exam:  Presentation  General Appearance:  Bizarre; Disheveled  Eye Contact: Minimal  Speech: Slurred; Garbled  Speech Volume: Decreased  Handedness: Right   Mood and Affect  Mood: Dysphoric  Affect: Constricted; flat   Thought Process  Thought Processes: Disorganized  Descriptions of Associations:Tangential  Orientation:Full (Time, Place and Person)  Thought Content:Delusions; Illogical; Perseveration  History of Schizophrenia/Schizoaffective disorder:Yes  Duration of Psychotic Symptoms:Greater than six months  Hallucinations:No data recorded did not report any AH or VH Ideas of Reference: Unclear, patient had previously reported paranoia and delusions  Suicidal Thoughts:No data recorded denies Homicidal Thoughts:No data recorded denies  Sensorium  Memory: Remote Fair; Immediate Fair; Recent Fair  Judgment: Poor  Insight: Poor   Executive Functions  Concentration: Poor  Attention Span: Poor   Psychomotor Activity  Psychomotor Activity:No data recorded Some stiffness of upper extremities, no tremor or cogwheeling  Assets  Assets: Desire for Improvement; Resilience; Leisure Time; Social Support   Sleep  Sleep:No data recorded documented 2.5 hours last night  Physical Exam: Physical Exam Vitals reviewed.  Constitutional:      Appearance: She is normal weight. She is ill-appearing.  Neurological:     Mental Status: She is alert.     Motor: Weakness present.    Review of Systems  Psychiatric/Behavioral:         Psychotic   Blood pressure 117/78,  pulse (!) 134, temperature 98.4 F (36.9 C), temperature source Oral, resp. rate 16, height '5\' 4"'$  (1.626 m), weight 70.3 kg, SpO2 96 %. Body mass index is 26.61 kg/m.     Treatment Plan Summary: Daily contact with patient to assess and evaluate symptoms and progress in treatment and medication management   ASSESSMENT: Brief Psychotic Disorder with catatpnic features  History of MDD  Rebecca Hoover is a 22 y.o., female with a past psychiatric history significant for Major depressive disorder, recurrent episode ([place]), GAD, speech impediment, SI who presents to the [facility] Involuntary from behavioral health urgent care [facility]) for evaluation and management of psychosis concerning for brief psychotic disorder with AVH.   PLAN: Safety and Monitoring:             -- Involuntary admission to inpatient psychiatric unit for safety, stabilization and treatment             -- Daily contact with patient to assess and evaluate symptoms and progress in treatment             --  Patient's case to be discussed in multi-disciplinary team meeting             -- Observation Level : q15 minute checks             -- Vital signs:  q12 hours             -- Precautions: suicide, elopement, and assault   2. Medications:               Psychiatric Diagnosis and Treatment   -restart risperdal 0.5 mg q8H tablet or liquid for psychosis -give ativan 1 mg IM now. Pt had positive response to ativan challenge, confirming catatonia diagnosis.  -start ativan 1 mg q8H for catatonia oral or IM  -start restoril 15 mg qhs for insomnia -change benztropine to 1 mg oral or IM for eps and drooling -continue atropine drops for drooling  -Continue lithium 300 mg every 12 hours for now -Previously tapered off Lexapro -restart trazodone 50 mg qhs prn  -continue bowel regimen    Agitation Protocol Benadryl 50 mg TID IV and oral as needed for agitation Haldol 5 mg TID IV and oral as needed for  agitation Ativan 2 mg TID IM and oral as needed for agitation   Patient does not need nicotine replacement   Medical Diagnosis and Treatment   Abnormal Urine Analysis - UA from the ED positive for large leukocytes, rare bacteria.  - Will repeat due to concern for UTI  Tachycardia -continue propranolol ER 60 mg once daily   Concern for constiaption -continue scheduled colace 100 mg bid  -continue miralax 17g daily   Sialorrhea -  -continue cogentin 1 mg bid -continue SL atropine drops    Other as needed medications  Tylenol 650 mg every 6 hours as needed for pain Mylanta 30 mL every 4 hours as needed for indigestion Milk of magnesia 30 mL daily as needed for constipation   3. Routine and other pertinent labs: EKG monitoring: QTc: 444   Metabolism / endocrine: BMI: Body mass index is 26.61 kg/m.    4. Group Therapy:             -- Encouraged patient to participate in unit milieu and in scheduled group therapies             -- We will address other chronic and acute stressors, which contributed to the patient's Brief psychotic disorder ([place]) in order to reduce the risk of self-harm at discharge.   5. Discharge Planning:              -- Social work and case management to assist with discharge planning and identification of hospital follow-up needs prior to discharge             -- Estimated LOS: 5-7 days             -- Discharge Concerns: Need to establish a safety plan; Medication compliance and effectiveness             -- Discharge Goals: Return home with outpatient referrals for mental health follow-up including medication management/psychotherapy   I certify that inpatient services furnished can reasonably be expected to improve the patient's condition.    Christoper Allegra, MD 01/09/2023, 11:46 AM  Total Time Spent in Direct Patient Care:  I personally spent 40 minutes on the unit in direct patient care. The direct patient care time included face-to-face time  with the patient, reviewing the patient's chart, communicating with other professionals, and  coordinating care. Greater than 50% of this time was spent in counseling or coordinating care with the patient regarding goals of hospitalization, psycho-education, and discharge planning needs.   Janine Limbo, MD Psychiatrist

## 2023-01-09 NOTE — Plan of Care (Signed)
  Problem: Activity: Goal: Will verbalize the importance of balancing activity with adequate rest periods Outcome: Not Progressing   Problem: Education: Goal: Will be free of psychotic symptoms Outcome: Not Progressing Goal: Knowledge of the prescribed therapeutic regimen will improve Outcome: Not Progressing   Problem: Coping: Goal: Coping ability will improve Outcome: Not Progressing   Problem: Nutritional: Goal: Ability to achieve adequate nutritional intake will improve Outcome: Not Progressing

## 2023-01-09 NOTE — Progress Notes (Signed)
Pt had difficulty falling asleep. MHT revealed patient was constantly walking around in her room and required reminders and redirection to lay down to seep. Pt would take short naps and then wake back up. Pt would also go into the bathroom and sit on the ground for periods of time.    01/09/23 0602  15 Minute Checks  Location Dayroom  Visual Appearance Calm  Behavior Composed  Sleep (Behavioral Health Patients Only)  Calculate sleep? (Click Yes once per 24 hr at 0600 safety check) Yes  Documented sleep last 24 hours 2.5

## 2023-01-09 NOTE — Group Note (Signed)
Recreation Therapy Group Note   Group Topic:Problem Solving  Group Date: 01/09/2023 Start Time: 1000 End Time: 1030 Facilitators: Jayme Mednick-McCall, LRT,CTRS Location: [address] Dayroom   Goal Area(s) Addresses:  Patient will effectively work with peer towards shared goal.  Patient will identify skill used to make activity successful.  Patient will identify how skills used during activity can be used to reach post d/c goals.   Group Description: Patient(s) were given a set of 10 solo cups, a rubber band, and some tied strings. The objective is to build a pyramid with the cups by only using the rubber band and string to move the cups. After the activity the patient(s) are LRT debriefed and discussed what strategies worked, what didn't, and what lessons they can take from the activity and use in life post discharge.    Affect/Mood: N/A   Participation Level: Did not attend    Clinical Observations/Individualized Feedback:      Plan: Continue to engage patient in RT group sessions 2-3x/week.   Deven Furia-McCall, LRT,CTRS 01/09/2023 11:22 AM

## 2023-01-09 NOTE — Progress Notes (Signed)
   01/09/23 1319  Psych Admission Type (Psych Patients Only)  Admission Status Involuntary  Psychosocial Assessment  Patient Complaints Anxiety  Eye Contact Fair  Facial Expression Blank  Affect Appropriate to circumstance  Speech Incoherent  Interaction Cautious  Motor Activity Slow  Appearance/Hygiene Disheveled  Behavior Characteristics Cooperative  Mood Pleasant  Thought Process  Coherency Disorganized  Content UTA  Delusions None reported or observed  Perception WDL  Hallucination None reported or observed  Judgment Poor  Confusion Mild  Danger to Self  Current suicidal ideation? Denies  Danger to Others  Danger to Others None reported or observed

## 2023-01-09 NOTE — Progress Notes (Addendum)
Patient is improving. Patient is up eating dinner. Chewing her food and swallowing without prompting. Patient is also verbally responding more clearly and appropriate. Patient also got and made a phone call.

## 2023-01-09 NOTE — BHH Group Notes (Signed)
Adult Psychoeducational Group Note  Date:  01/09/2023 Time:  9:23 AM  Group Topic/Focus:  Goals Group:   The focus of this group is to help patients establish daily goals to achieve during treatment and discuss how the patient can incorporate goal setting into their daily lives to aide in recovery.  Participation Level:  Did Not Attend    Dub Mikes 01/09/2023, 9:23 AM

## 2023-01-09 NOTE — Progress Notes (Signed)
Patient observed in her room laying in bed. Incoherent slurred speech and drooling noted. Poor appetite. IM ativan ordered and administered. Patient isolated to room in bed. Medication given with multiple sips of water. Patient had to be prompted to swallow.

## 2023-01-09 NOTE — BHH Group Notes (Signed)
Pt. Didn't attend group.

## 2023-01-10 DIAGNOSIS — F23 Brief psychotic disorder: Secondary | ICD-10-CM | POA: Diagnosis not present

## 2023-01-10 MED ORDER — RISPERIDONE 0.5 MG PO TBDP
0.5000 mg | ORAL_TABLET | Freq: Two times a day (BID) | ORAL | Status: DC
  Administered 2023-01-10 – 2023-01-11 (×3): 0.5 mg via ORAL
  Filled 2023-01-10 (×6): qty 1

## 2023-01-10 MED ORDER — RISPERIDONE 1 MG PO TBDP
1.0000 mg | ORAL_TABLET | Freq: Every day | ORAL | Status: DC
  Administered 2023-01-10: 1 mg via ORAL
  Filled 2023-01-10 (×4): qty 1

## 2023-01-10 MED ORDER — BENZTROPINE MESYLATE 0.5 MG PO TABS
0.5000 mg | ORAL_TABLET | Freq: Two times a day (BID) | ORAL | Status: DC
  Administered 2023-01-10 – 2023-01-14 (×8): 0.5 mg via ORAL
  Filled 2023-01-10 (×12): qty 1

## 2023-01-10 MED ORDER — RISPERIDONE 1 MG/ML PO SOLN: 0.5000 mg | Freq: Two times a day (BID) | ORAL | Status: DC

## 2023-01-10 MED ORDER — LORAZEPAM 1 MG PO TABS
1.5000 mg | ORAL_TABLET | Freq: Three times a day (TID) | ORAL | Status: DC
  Administered 2023-01-10 – 2023-01-11 (×4): 1.5 mg via ORAL
  Filled 2023-01-10 (×4): qty 1

## 2023-01-10 MED ORDER — LORAZEPAM 2 MG/ML IJ SOLN: 1.5000 mg | Freq: Three times a day (TID) | INTRAMUSCULAR | Status: DC

## 2023-01-10 NOTE — Progress Notes (Signed)
[facility] MD Progress Note  01/10/2023 12:44 PM Rebecca Hoover  MRN:  [id number]  Subjective:   Rebecca Hoover is a 22 y.o. female with a history of  Major depressive disorder, recurrent episode ([place]), GAD, speech impediment, SI , who was initially admitted for inpatient psychiatric hospitalization on 01/01/2023 for management of brief psychotic disorder.    I interviewed this patient twice today.  Overall she is doing better compared to yesterday.  She is speaking more.  Less catatonic.  Able to eat, swallow her food, maintain control of saliva.  Denies any auditory hallucinations (unclear if this she has denied this in the past when she was actively responding to internal stimuli) and today does not appear to be responding to internal stimuli.  She still has some confusion and disorganized thinking. Sleep is better with starting Restoril.  Overall, patient is still psychotic, catatonia has responded well to restarting Ativan.  She is still requiring psychiatric hospitalization for level of care if she would be unable to manage any ADLs and IADLs outside of the hospital.  Per nursing, patient is doing better, eating food, catatonia is less.  No drooling.  Still has thought delay.  Principal Problem: Brief psychotic disorder ([place]) Diagnosis: Principal Problem:   Brief psychotic disorder ([place]) Active Problems:   Suicidal ideation   Speech impediment   Generalized anxiety disorder   Major depressive disorder, recurrent episode ([place])  Total Time spent with patient: 25 minutes  Past Psychiatric History:  Previous Psych Diagnoses: Major Depressive Disorder Prior psychiatric treatment: None Psychiatric medication compliance history: Compliant   Current psychiatric treatment:  Lexapro 20 mg daily for MDD, started around spring of 2023 Current psychiatrist: Last saw [facility] 6 months ago Current therapist: Denies per mother   Previous hospitalizations Denies per mother History of suicide  attempts: Denies per mother History of self harm: Denies per mother  Past Medical History: No past medical history on file. No past surgical history on file. Family History: No family history on file.  Family Psychiatric  History:  Medical: Denies per mother Psych: 60 Sister-Bipolar Disorder Psych Rx: Unknown Suicide: Denies per mother Substance use family hx: Denies per mother  Social History:  Social History   Substance and Sexual Activity  Alcohol Use None     Social History   Substance and Sexual Activity  Drug Use Not on file    Social History   Socioeconomic History   Marital status: Widowed    Spouse name: Not on file   Number of children: Not on file   Years of education: Not on file   Highest education level: Not on file  Occupational History   Not on file  Tobacco Use   Smoking status: Unknown   Smokeless tobacco: Not on file  Substance and Sexual Activity   Alcohol use: Not on file   Drug use: Not on file   Sexual activity: Not on file  Other Topics Concern   Not on file  Social History Narrative   ** Merged History Encounter **       Social Determinants of Health   Financial Resource Strain: Not on file  Food Insecurity: Not on file  Transportation Needs: Not on file  Physical Activity: Not on file  Stress: Not on file  Social Connections: Not on file   Additional Social History:                         Sleep: Better  Appetite: Better  Current Medications: Current Facility-Administered Medications  Medication Dose Route Frequency Provider Last Rate Last Admin   acetaminophen (TYLENOL) tablet 650 mg  650 mg Oral Q6H PRN Evette Georges, NP   650 mg at 01/06/23 2007   alum & mag hydroxide-simeth (MAALOX/MYLANTA) 200-200-20 MG/5ML suspension 30 mL  30 mL Oral Q4H PRN Evette Georges, NP       atropine 1 % ophthalmic solution 1 drop  1 drop Sublingual TID Janine Limbo, MD   1 drop at 01/10/23 1131   benztropine (COGENTIN)  tablet 0.5 mg  0.5 mg Oral BID Zyana Amaro, Ovid Curd, MD       haloperidol (HALDOL) tablet 5 mg  5 mg Oral TID PRN Janine Limbo, MD   5 mg at 01/04/23 0300   And   LORazepam (ATIVAN) tablet 2 mg  2 mg Oral TID PRN Janine Limbo, MD       And   diphenhydrAMINE (BENADRYL) capsule 50 mg  50 mg Oral TID PRN Janine Limbo, MD   50 mg at 01/04/23 0300   haloperidol lactate (HALDOL) injection 5 mg  5 mg Intramuscular TID PRN Janine Limbo, MD       And   LORazepam (ATIVAN) injection 2 mg  2 mg Intramuscular TID PRN Laynee Lockamy, Ovid Curd, MD       And   diphenhydrAMINE (BENADRYL) injection 50 mg  50 mg Intramuscular TID PRN Tasheba Henson, Ovid Curd, MD       docusate sodium (COLACE) capsule 100 mg  100 mg Oral BID Keonte Daubenspeck, Ovid Curd, MD   100 mg at 01/10/23 0806   feeding supplement (ENSURE ENLIVE / ENSURE PLUS) liquid 237 mL  237 mL Oral BID BM Nicholes Rough, NP   237 mL at 01/10/23 0948   hydrOXYzine (ATARAX) tablet 25 mg  25 mg Oral TID PRN Janine Limbo, MD   25 mg at 01/02/23 2355   lithium carbonate (LITHOBID) ER tablet 300 mg  300 mg Oral Q12H Coralyn Pear B, MD   300 mg at 01/10/23 0806   LORazepam (ATIVAN) tablet 1 mg  1 mg Oral Q6H PRN Anicka Stuckert, Ovid Curd, MD       Or   LORazepam (ATIVAN) injection 1 mg  1 mg Intramuscular Q6H PRN Samina Weekes, Ovid Curd, MD       LORazepam (ATIVAN) tablet 1.5 mg  1.5 mg Oral Q8H Kratos Ruscitti, MD       Or   LORazepam (ATIVAN) injection 1.5 mg  1.5 mg Intramuscular Q8H Jalise Zawistowski, MD       magnesium hydroxide (MILK OF MAGNESIA) suspension 30 mL  30 mL Oral Daily PRN Evette Georges, NP   30 mL at 01/06/23 2007   norethindrone-ethinyl estradiol (LOESTRIN) 1-20 MG-MCG tablet 1 tablet  1 tablet Oral Daily Evette Georges, NP       polyethylene glycol (MIRALAX / GLYCOLAX) packet 17 g  17 g Oral Daily Knut Rondinelli, Ovid Curd, MD   17 g at 01/10/23 1540   propranolol ER (INDERAL LA) 24 hr capsule 60 mg  60 mg Oral Daily Roniqua Kintz, Ovid Curd, MD   60  mg at 01/10/23 0867   risperiDONE (RISPERDAL M-TABS) disintegrating tablet 0.5 mg  0.5 mg Oral BID Jacky Dross, Ovid Curd, MD       risperiDONE (RISPERDAL M-TABS) disintegrating tablet 1 mg  1 mg Oral QHS Kaimen Peine, MD       temazepam (RESTORIL) capsule 15 mg  15 mg Oral QHS PRN Shantae Vantol, Ovid Curd, MD       traZODone (DESYREL) tablet 50  mg  50 mg Oral QHS PRN Shantel Helwig, Ovid Curd, MD        Lab Results:  No results found for this or any previous visit (from the past 48 hour(s)).   Blood Alcohol level:  Lab Results  Component Value Date   ETH <10 20/35/5974    Metabolic Disorder Labs: Lab Results  Component Value Date   HGBA1C 4.8 01/02/2023   MPG 91.06 01/02/2023   MPG 88.19 12/29/2022   No results found for: "PROLACTIN" Lab Results  Component Value Date   CHOL 128 01/02/2023   TRIG 34 01/02/2023   HDL 54 01/02/2023   CHOLHDL 2.4 01/02/2023   VLDL 7 01/02/2023   LDLCALC 67 01/02/2023   LDLCALC 88 12/29/2022    Physical Findings: AIMS: Facial and Oral Movements Muscles of Facial Expression: None, normal Lips and Perioral Area: None, normal Jaw: None, normal Tongue: None, normal,Extremity Movements Upper (arms, wrists, hands, fingers): None, normal Lower (legs, knees, ankles, toes): None, normal, Trunk Movements Neck, shoulders, hips: None, normal, Overall Severity Severity of abnormal movements (highest score from questions above): None, normal Incapacitation due to abnormal movements: None, normal Patient's awareness of abnormal movements (rate only patient's report): No Awareness, Dental Status Current problems with teeth and/or dentures?: No Does patient usually wear dentures?: No  CIWA:    COWS:     Musculoskeletal: Strength & Muscle Tone: Some stiffness of bilateral upper extremities no tremor or cogwheeling Gait & Station: Did not observe her gait today Patient leans: N/A  Psychiatric Specialty Exam:  Presentation  General Appearance:  Bizarre;  Disheveled  Eye Contact: Minimal  Speech: Slurred; Garbled  Speech Volume: Decreased  Handedness: Right   Mood and Affect  Mood: Dysphoric  Affect: Constricted; flat   Thought Process  Thought Processes: Disorganized  Descriptions of Associations:Tangential  Orientation:Full (Time, Place and Person)  Thought Content:Delusions; Illogical; Perseveration  History of Schizophrenia/Schizoaffective disorder:Yes  Duration of Psychotic Symptoms:Greater than six months  Hallucinations:No data recorded did not report any AH or VH Ideas of Reference: Unclear, patient had previously reported paranoia and delusions  Suicidal Thoughts:No data recorded denies Homicidal Thoughts:No data recorded denies  Sensorium  Memory: Remote Fair; Immediate Fair; Recent Fair  Judgment: Poor  Insight: Poor   Executive Functions  Concentration: Poor  Attention Span: Poor   Psychomotor Activity  Psychomotor Activity:No data recorded Some stiffness of upper extremities, no tremor or cogwheeling  Assets  Assets: Desire for Improvement; Resilience; Leisure Time; Social Support   Sleep  Sleep:No data recorded documented 2.5 hours last night  Physical Exam: Physical Exam Vitals reviewed.  Constitutional:      Appearance: She is normal weight. She is ill-appearing.  Neurological:     Mental Status: She is alert.     Motor: Weakness present.    Review of Systems  Psychiatric/Behavioral:         Psychotic   Blood pressure 109/72, pulse (!) 130, temperature 98.4 F (36.9 C), temperature source Oral, resp. rate 16, height '5\' 4"'$  (1.626 m), weight 70.3 kg, SpO2 98 %. Body mass index is 26.61 kg/m.     Treatment Plan Summary: Daily contact with patient to assess and evaluate symptoms and progress in treatment and medication management   ASSESSMENT: Brief Psychotic Disorder with catatpnic features  History of MDD  Rebecca Hoover is a 22 y.o., female with a past  psychiatric history significant for Major depressive disorder, recurrent episode ([place]), GAD, speech impediment, SI who presents to the [facility] Involuntary  from behavioral health urgent care [facility]) for evaluation and management of psychosis concerning for brief psychotic disorder with AVH.   PLAN: Safety and Monitoring:             -- Involuntary admission to inpatient psychiatric unit for safety, stabilization and treatment             -- Daily contact with patient to assess and evaluate symptoms and progress in treatment             -- Patient's case to be discussed in multi-disciplinary team meeting             -- Observation Level : q15 minute checks             -- Vital signs:  q12 hours             -- Precautions: suicide, elopement, and assault   2. Medications:               Psychiatric Diagnosis and Treatment   -Increase risperdal from 0.5 mg q8H -> to 0.5 mg at 0800, 0.5 mg at 1400, and 1 mg at qhs - for psychosis -Increase ativan from 1 mg q8H -> to 1.5 mg q8H - for catatonia oral or IM  -Continue restoril 15 mg qhs for insomnia -Decrease benztropine to 0.5 mg - for eps and drooling -continue atropine drops for drooling  -Continue lithium 300 mg every 12 hours for now -Previously tapered off Lexapro -Continue trazodone 50 mg qhs prn  -continue bowel regimen    Agitation Protocol Benadryl 50 mg TID IV and oral as needed for agitation Haldol 5 mg TID IV and oral as needed for agitation Ativan 2 mg TID IM and oral as needed for agitation   Patient does not need nicotine replacement   Medical Diagnosis and Treatment   Abnormal Urine Analysis - UA from the ED positive for large leukocytes, rare bacteria.  - Will repeat due to concern for UTI  Tachycardia -continue propranolol ER 60 mg once daily   Concern for constiaption -continue scheduled colace 100 mg bid  -continue miralax 17g daily   Sialorrhea -  -continue cogentin 1 mg bid -continue SL  atropine drops    Other as needed medications  Tylenol 650 mg every 6 hours as needed for pain Mylanta 30 mL every 4 hours as needed for indigestion Milk of magnesia 30 mL daily as needed for constipation   3. Routine and other pertinent labs: EKG monitoring: QTc: 444   Metabolism / endocrine: BMI: Body mass index is 26.61 kg/m.    4. Group Therapy:             -- Encouraged patient to participate in unit milieu and in scheduled group therapies             -- We will address other chronic and acute stressors, which contributed to the patient's Brief psychotic disorder ([place]) in order to reduce the risk of self-harm at discharge.   5. Discharge Planning:              -- Social work and case management to assist with discharge planning and identification of hospital follow-up needs prior to discharge             -- Estimated LOS: 5-7 days             -- Discharge Concerns: Need to establish a safety plan; Medication compliance and effectiveness             --  Discharge Goals: Return home with outpatient referrals for mental health follow-up including medication management/psychotherapy   I certify that inpatient services furnished can reasonably be expected to improve the patient's condition.    Christoper Allegra, MD 01/10/2023, 12:44 PM  Total Time Spent in Direct Patient Care:  I personally spent 40 minutes on the unit in direct patient care. The direct patient care time included face-to-face time with the patient, reviewing the patient's chart, communicating with other professionals, and coordinating care. Greater than 50% of this time was spent in counseling or coordinating care with the patient regarding goals of hospitalization, psycho-education, and discharge planning needs.   Janine Limbo, MD Psychiatrist

## 2023-01-10 NOTE — Progress Notes (Signed)
Patient alert. Speech improving, still not clear. Able to respond. Appetite improving. Ate 100% of breakfast and drank Ensure. Still laying in bed, isolated to room. Fluids encouraged.

## 2023-01-10 NOTE — Progress Notes (Signed)
   01/10/23 0958  Psych Admission Type (Psych Patients Only)  Admission Status Involuntary  Psychosocial Assessment  Patient Complaints Depression  Eye Contact Fair  Facial Expression Blank  Affect Appropriate to circumstance  Speech Soft;Slurred  Interaction Cautious  Motor Activity Slow  Appearance/Hygiene Disheveled  Behavior Characteristics Cooperative  Mood Depressed;Pleasant  Thought Process  Coherency Disorganized  Content UTA  Delusions None reported or observed  Perception UTA  Hallucination None reported or observed  Judgment Poor  Confusion Mild  Danger to Self  Current suicidal ideation? Denies  Danger to Others  Danger to Others None reported or observed

## 2023-01-10 NOTE — Progress Notes (Signed)
Pt BP decreased when standing up. Pt asymptomatic.  Pt denies dizziness , fatigue, or weakness. Pt ambulatory with steady gait. Pt does not have any complaints.  Pt given fluids and encouraged to drink a substantial amount. Will reassess after breakfast an fluids.    01/10/23 0602 01/10/23 0603  Vital Signs  Temp 98.4 F (36.9 C)  --   Temp Source Oral  --   Pulse Rate 88 (!) 135  Pulse Rate Source  --  Monitor  BP 97/64 (!) 89/61  BP Location Right Arm Right Arm  BP Method Automatic Automatic  Patient Position (if appropriate) Sitting Standing  Oxygen Therapy  SpO2  --  98 %

## 2023-01-10 NOTE — Progress Notes (Signed)
Patient did not attend morning orientation group.  

## 2023-01-10 NOTE — Progress Notes (Signed)
Pt slept majority of the shift.    01/10/23 0600  15 Minute Checks  Location Bedroom  Visual Appearance Calm  Behavior Composed  Sleep (Behavioral Health Patients Only)  Calculate sleep? (Click Yes once per 24 hr at 0600 safety check) Yes  Documented sleep last 24 hours 14.5

## 2023-01-10 NOTE — Plan of Care (Signed)
  Problem: Health Behavior/Discharge Planning: Goal: Compliance with prescribed medication regimen will improve Outcome: Progressing   Problem: Education: Goal: Knowledge of the prescribed therapeutic regimen will improve Outcome: Not Progressing   Problem: Coping: Goal: Will verbalize feelings Outcome: Not Progressing

## 2023-01-10 NOTE — BHH Group Notes (Signed)
Adult Psychoeducational Group Note  Date:  01/10/2023 Time:  8:25 PM  Group Topic/Focus:  Conflict Resolution:   The focus of this group is to discuss the conflict resolution process and how it may be used upon discharge.  Participation Level:  Minimal  Participation Quality:  Appropriate and Attentive  Affect:  Appropriate  Cognitive:  Alert  Insight: Improving  Engagement in Group:  Engaged  Modes of Intervention:  Discussion  Additional Comments  Dalene Carrow 01/10/2023, 8:25 PM

## 2023-01-10 NOTE — Group Note (Signed)
LCSW Group Therapy Note  Group Date: 01/10/2023 Start Time: 1300 End Time: 1400   Type of Therapy and Topic:  Group Therapy - How To Cope with Nervousness about Discharge   Participation Level:  Did Not Attend   Description of Group This process group involved identification of patients' feelings about discharge. Some of them are scheduled to be discharged soon, while others are new admissions, but each of them was asked to share thoughts and feelings surrounding discharge from the hospital. One common theme was that they are excited at the prospect of going home, while another was that many of them are apprehensive about sharing why they were hospitalized. Patients were given the opportunity to discuss these feelings with their peers in preparation for discharge.  Therapeutic Goals  Patient will identify their overall feelings about pending discharge. Patient will think about how they might proactively address issues that they believe will once again arise once they get home (i.e. with parents). Patients will participate in discussion about having hope for change.   Summary of Patient Progress:  x   Therapeutic Modalities Cognitive Behavioral Therapy   Zachery Conch, LCSW 01/10/2023  2:12 PM

## 2023-01-10 NOTE — Group Note (Signed)
Recreation Therapy Group Note   Group Topic:Coping Skills  Group Date: 01/10/2023 Start Time: 1000 End Time: 1035 Facilitators: Whitley Strycharz-McCall, LRT,CTRS Location: [address] Dayroom   Goal Area(s) Addresses: Patient will define what a coping skill is. Patient will work with peer to create a list of healthy coping skills beginning with each letter of the alphabet. Patient will successfully identify positive coping skills they can use post d/c.  Patient will acknowledge benefit(s) of using learned coping skills post d/c.   Group Description: Coping A to Z. Patient asked to identify what a coping skill is and when they use them. Patients with Probation officer discussed healthy versus unhealthy coping skills. Next patients were given a blank worksheet titled "Coping Skills A-Z". Partners were instructed to come up with at least one positive coping skill per letter of the alphabet, addressing a specific challenge (ex: stress, anger, anxiety, depression, grief, doubt, isolation, self-harm/suicidal thoughts, substance use). Patients were given 15 minutes to brainstorm, before ideas were presented to the large group. Patients and LRT debriefed on the importance of coping skill selection based on situation and back-up plans when a skill tried is not effective. At the end of group, patients were given an handout of alphabetized strategies to keep for future reference.   Affect/Mood: N/A   Participation Level: Did not attend    Clinical Observations/Individualized Feedback:     Plan: Continue to engage patient in RT group sessions 2-3x/week.   Klinton Candelas-McCall, LRT,CTRS  01/10/2023 12:41 PM

## 2023-01-11 ENCOUNTER — Encounter (HOSPITAL_COMMUNITY): Payer: Self-pay

## 2023-01-11 DIAGNOSIS — F23 Brief psychotic disorder: Secondary | ICD-10-CM | POA: Diagnosis not present

## 2023-01-11 MED ORDER — LORAZEPAM 1 MG PO TABS
1.5000 mg | ORAL_TABLET | Freq: Every day | ORAL | Status: DC
  Administered 2023-01-11 – 2023-01-12 (×2): 1.5 mg via ORAL
  Filled 2023-01-11 (×2): qty 1

## 2023-01-11 MED ORDER — LORAZEPAM 1 MG PO TABS
1.0000 mg | ORAL_TABLET | Freq: Two times a day (BID) | ORAL | Status: DC
  Administered 2023-01-12 (×2): 1 mg via ORAL
  Filled 2023-01-11 (×3): qty 1

## 2023-01-11 MED ORDER — RISPERIDONE 1 MG PO TBDP
1.0000 mg | ORAL_TABLET | Freq: Every day | ORAL | Status: AC
  Administered 2023-01-11: 1 mg via ORAL
  Filled 2023-01-11: qty 1

## 2023-01-11 MED ORDER — RISPERIDONE 2 MG PO TBDP
2.0000 mg | ORAL_TABLET | Freq: Every day | ORAL | Status: DC
  Administered 2023-01-12 – 2023-01-13 (×2): 2 mg via ORAL
  Filled 2023-01-11 (×5): qty 1

## 2023-01-11 MED ORDER — TEMAZEPAM 7.5 MG PO CAPS: 7.5000 mg | ORAL_CAPSULE | Freq: Every evening | ORAL | Status: DC | PRN

## 2023-01-11 NOTE — Group Note (Signed)
Recreation Therapy Group Note   Group Topic:Problem Solving  Group Date: 01/11/2023 Start Time: 1030 End Time: 1055 Facilitators: Austan Nicholl-McCall, LRT,CTRS Location: [address]   Goal Area(s) Addresses:  Patient will effectively work with peer towards shared goal.  Patient will identify skills used to make activity successful.  Patient will identify how skills used during activity can be applied to reach post d/c goals.   Group Description: [employer]. In teams of 5-6, patients were given 12 craft pipe cleaners. Using the materials provided, patients were instructed to compete again the opposing team(s) to build the tallest free-standing structure from floor level. The activity was timed; difficulty increased by Probation officer as Pharmacist, hospital continued.  Systematically resources were removed with additional directions for example, placing one arm behind their back, working in silence, and shape stipulations. LRT facilitated post-activity discussion reviewing team processes and necessary communication skills involved in completion. Patients were encouraged to reflect how the skills utilized, or not utilized, in this activity can be incorporated to positively impact support systems post discharge.   Affect/Mood: N/A   Participation Level: Did not attend    Clinical Observations/Individualized Feedback:     Plan: Continue to engage patient in RT group sessions 2-3x/week.   Arynn Armand-McCall, LRT,CTRS 01/11/2023 1:49 PM

## 2023-01-11 NOTE — Progress Notes (Signed)
Adult Psychoeducational Group Note  Date:  01/11/2023 Time:  10:19 AM  Pt did not attend goals group.

## 2023-01-11 NOTE — [facility] Group Notes (Signed)
Spirituality group facilitated by Kathrynn Humble, [place].  Group Description: Group focused on topic of community. Patients participated in facilitated discussion around topic, connecting with one another around experiences and definitions for community. Group members engaged with visual explorer photos, reflecting on what community looks like for them today. Group engaged in discussion around how their definitions of community are present today in hospital.   Patient progress: Rebecca Hoover attended group and was in and out of engagement prior to leaving group early.  When she did verbally participate, her speech was much more clear than the previous week.  Her comments were on topic and appropriate.  [address] [place] Pager, [phone]

## 2023-01-11 NOTE — Progress Notes (Signed)
   01/11/23 1330  Psych Admission Type (Psych Patients Only)  Admission Status Involuntary  Psychosocial Assessment  Patient Complaints Depression  Eye Contact Fair  Facial Expression Blank  Affect Depressed  Speech Soft;Slurred  Interaction Cautious  Motor Activity Slow  Appearance/Hygiene Disheveled  Behavior Characteristics Cooperative  Mood Depressed;Pleasant  Aggressive Behavior  Effect No apparent injury  Thought Process  Coherency Disorganized  Content UTA  Delusions None reported or observed  Perception UTA  Hallucination None reported or observed  Judgment Poor  Confusion Mild  Danger to Self  Current suicidal ideation? Denies  Danger to Others  Danger to Others None reported or observed

## 2023-01-11 NOTE — Progress Notes (Signed)
   01/11/23 0600  15 Minute Checks  Location Bedroom  Visual Appearance Calm  Behavior Composed  Sleep (Behavioral Health Patients Only)  Calculate sleep? (Click Yes once per 24 hr at 0600 safety check) Yes  Documented sleep last 24 hours 13.5

## 2023-01-11 NOTE — Progress Notes (Signed)
Patient is alert and oriented. Calm and cooperative with staff and during assessment. Compliant with medication orders.Denies SI/HI/AVH. Eating more than 50% at meal time. Consuming 100% of ensure supplements. Denies pain. Attending group. No distress noted.

## 2023-01-11 NOTE — Progress Notes (Addendum)
[facility] MD Progress Note  01/11/2023 4:35 PM Rebecca Hoover  MRN:  [id number]  Subjective:   Rebecca Hoover is a 22 y.o. female with a history of  Major depressive disorder, recurrent episode ([place]), GAD, speech impediment, SI , who was initially admitted for inpatient psychiatric hospitalization on 01/01/2023 for management of brief psychotic disorder.   Yesterday the psych team made the following recommendations: -Increase risperdal from 0.5 mg q8H -> to 0.5 mg at 0800, 0.5 mg at 1400, and 1 mg at qhs - for psychosis -Increase ativan from 1 mg q8H -> to 1.5 mg q8H - for catatonia oral or IM  -Continue restoril 15 mg qhs prn for insomnia -Decrease benztropine to 0.5 mg - for eps and drooling -continue atropine drops for drooling  -Continue lithium 300 mg every 12 hours for now -Previously tapered off Lexapro -Continue trazodone 50 mg qhs prn  -continue bowel regimen   On my assessment today, the patient continues to have improvement over the last few days.  She is speaking more.  Her speech is actually more logical and organized.  She provides rational responses to questions answered.  She is able to maintain control of saliva and no other complaints of sialorrhea.  She denies any AH.  She does not appear to be responding to internal stimuli.  She reports that mood is not depressed. Sleep is better.  She is eating all of her bills.  According to nursing reports, the patient is doing much better over the last few days.  We will decrease sedating medications, to reduce daytime sedation. No eps on my exam today.  Overall, patient is less psychotic. catatonia has responded well to restarting and titrating Ativan.  She is still requiring psychiatric hospitalization for level of care if she would be unable to manage any ADLs and IADLs outside of the hospital.    Principal Problem: Brief psychotic disorder ([place]) Diagnosis: Principal Problem:   Brief psychotic disorder ([place]) Active Problems:    Suicidal ideation   Speech impediment   Generalized anxiety disorder   Major depressive disorder, recurrent episode ([place])  Total Time spent with patient: 25 minutes  Past Psychiatric History:  Previous Psych Diagnoses: Major Depressive Disorder Prior psychiatric treatment: None Psychiatric medication compliance history: Compliant   Current psychiatric treatment:  Lexapro 20 mg daily for MDD, started around spring of 2023 Current psychiatrist: Last saw [facility] 6 months ago Current therapist: Denies per mother   Previous hospitalizations Denies per mother History of suicide attempts: Denies per mother History of self harm: Denies per mother  Past Medical History: No past medical history on file. No past surgical history on file. Family History: No family history on file.  Family Psychiatric  History:  Medical: Denies per mother Psych: 95 Sister-Bipolar Disorder Psych Rx: Unknown Suicide: Denies per mother Substance use family hx: Denies per mother  Social History:  Social History   Substance and Sexual Activity  Alcohol Use None     Social History   Substance and Sexual Activity  Drug Use Not on file    Social History   Socioeconomic History   Marital status: Widowed    Spouse name: Not on file   Number of children: Not on file   Years of education: Not on file   Highest education level: Not on file  Occupational History   Not on file  Tobacco Use   Smoking status: Unknown   Smokeless tobacco: Not on file  Substance and Sexual Activity  Alcohol use: Not on file   Drug use: Not on file   Sexual activity: Not on file  Other Topics Concern   Not on file  Social History Narrative   ** Merged History Encounter **       Social Determinants of Health   Financial Resource Strain: Not on file  Food Insecurity: Not on file  Transportation Needs: Not on file  Physical Activity: Not on file  Stress: Not on file  Social Connections: Not on file    Additional Social History:                         Sleep: Better  Appetite: Better  Current Medications: Current Facility-Administered Medications  Medication Dose Route Frequency Provider Last Rate Last Admin   acetaminophen (TYLENOL) tablet 650 mg  650 mg Oral Q6H PRN Evette Georges, NP   650 mg at 01/06/23 2007   alum & mag hydroxide-simeth (MAALOX/MYLANTA) 200-200-20 MG/5ML suspension 30 mL  30 mL Oral Q4H PRN Evette Georges, NP       atropine 1 % ophthalmic solution 1 drop  1 drop Sublingual TID Tinamarie Przybylski, Ovid Curd, MD   1 drop at 01/11/23 1216   benztropine (COGENTIN) tablet 0.5 mg  0.5 mg Oral BID Cleston Lautner, Ovid Curd, MD   0.5 mg at 01/11/23 0818   haloperidol (HALDOL) tablet 5 mg  5 mg Oral TID PRN Janine Limbo, MD   5 mg at 01/04/23 0300   And   LORazepam (ATIVAN) tablet 2 mg  2 mg Oral TID PRN Janine Limbo, MD       And   diphenhydrAMINE (BENADRYL) capsule 50 mg  50 mg Oral TID PRN Janine Limbo, MD   50 mg at 01/04/23 0300   haloperidol lactate (HALDOL) injection 5 mg  5 mg Intramuscular TID PRN Baylea Milburn, Ovid Curd, MD       And   LORazepam (ATIVAN) injection 2 mg  2 mg Intramuscular TID PRN Graciano Batson, Ovid Curd, MD       And   diphenhydrAMINE (BENADRYL) injection 50 mg  50 mg Intramuscular TID PRN Francely Craw, Ovid Curd, MD       docusate sodium (COLACE) capsule 100 mg  100 mg Oral BID Rody Keadle, Ovid Curd, MD   100 mg at 01/11/23 0818   feeding supplement (ENSURE ENLIVE / ENSURE PLUS) liquid 237 mL  237 mL Oral BID BM Nkwenti, Doris, NP   237 mL at 01/11/23 1453   hydrOXYzine (ATARAX) tablet 25 mg  25 mg Oral TID PRN Janine Limbo, MD   25 mg at 01/02/23 2355   lithium carbonate (LITHOBID) ER tablet 300 mg  300 mg Oral Q12H Coralyn Pear B, MD   300 mg at 01/11/23 0818   LORazepam (ATIVAN) tablet 1 mg  1 mg Oral Q6H PRN Emmaclaire Switala, Ovid Curd, MD       Or   LORazepam (ATIVAN) injection 1 mg  1 mg Intramuscular Q6H PRN Caileb Rhue, Ovid Curd, MD        LORazepam (ATIVAN) tablet 1.5 mg  1.5 mg Oral Q8H Estefania Kamiya, MD   1.5 mg at 01/11/23 1314   Or   LORazepam (ATIVAN) injection 1.5 mg  1.5 mg Intramuscular Q8H Anabeth Chilcott, MD       magnesium hydroxide (MILK OF MAGNESIA) suspension 30 mL  30 mL Oral Daily PRN Evette Georges, NP   30 mL at 01/06/23 2007   norethindrone-ethinyl estradiol (LOESTRIN) 1-20 MG-MCG tablet 1 tablet  1 tablet Oral Daily Evette Georges,  NP       polyethylene glycol (MIRALAX / GLYCOLAX) packet 17 g  17 g Oral Daily Illyana Schorsch, MD   17 g at 01/11/23 0816   propranolol ER (INDERAL LA) 24 hr capsule 60 mg  60 mg Oral Daily Jeweline Reif, MD   60 mg at 01/11/23 4315   risperiDONE (RISPERDAL M-TABS) disintegrating tablet 0.5 mg  0.5 mg Oral BID Darrnell Mangiaracina, Ovid Curd, MD   0.5 mg at 01/11/23 1313   risperiDONE (RISPERDAL M-TABS) disintegrating tablet 1 mg  1 mg Oral QHS Tiffiney Sparrow, MD   1 mg at 01/10/23 2054   temazepam (RESTORIL) capsule 15 mg  15 mg Oral QHS PRN Abe Schools, Ovid Curd, MD       traZODone (DESYREL) tablet 50 mg  50 mg Oral QHS PRN Jesicca Dipierro, Ovid Curd, MD        Lab Results:  No results found for this or any previous visit (from the past 48 hour(s)).   Blood Alcohol level:  Lab Results  Component Value Date   ETH <10 40/07/6760    Metabolic Disorder Labs: Lab Results  Component Value Date   HGBA1C 4.8 01/02/2023   MPG 91.06 01/02/2023   MPG 88.19 12/29/2022   No results found for: "PROLACTIN" Lab Results  Component Value Date   CHOL 128 01/02/2023   TRIG 34 01/02/2023   HDL 54 01/02/2023   CHOLHDL 2.4 01/02/2023   VLDL 7 01/02/2023   LDLCALC 67 01/02/2023   LDLCALC 88 12/29/2022    Physical Findings: AIMS: Facial and Oral Movements Muscles of Facial Expression: None, normal Lips and Perioral Area: None, normal Jaw: None, normal Tongue: None, normal,Extremity Movements Upper (arms, wrists, hands, fingers): None, normal Lower (legs, knees, ankles, toes): None,  normal, Trunk Movements Neck, shoulders, hips: None, normal, Overall Severity Severity of abnormal movements (highest score from questions above): None, normal Incapacitation due to abnormal movements: None, normal Patient's awareness of abnormal movements (rate only patient's report): No Awareness, Dental Status Current problems with teeth and/or dentures?: No Does patient usually wear dentures?: No  CIWA:    COWS:     Musculoskeletal: Strength & Muscle Tone: nml Gait & Station: Did not observe her gait today Patient leans: N/A  Psychiatric Specialty Exam:  Presentation  General Appearance:  Bizarre; Disheveled  Eye Contact: Minimal  Speech: Slurred; Garbled  Speech Volume: Decreased  Handedness: Right   Mood and Affect  Mood: Euthymic  Affect: Less constricted   Thought Process  Thought Processes: Less disorganized  Descriptions of Associations: More linear  Orientation:Full (Time, Place and Person)  Thought Content: No overt delusions or paranoid thought process present  History of Schizophrenia/Schizoaffective disorder:Yes  Duration of Psychotic Symptoms: Unclear, probably about 1 month  Hallucinations:No data recorded did not report any AH or VH Ideas of Reference: Unclear, patient had previously reported paranoia and delusions  Suicidal Thoughts:No data recorded denies Homicidal Thoughts:No data recorded denies  Sensorium  Memory: Remote Fair; Immediate Fair; Recent Fair  Judgment: Poor  Insight: Poor   Executive Functions  Concentration: Poor  Attention Span: Poor   Psychomotor Activity  Psychomotor Activity:No data recorded Some stiffness of upper extremities, no tremor or cogwheeling  Assets  Assets: Desire for Improvement; Resilience; Leisure Time; Social Support   Sleep  Sleep:No data recorded   Physical Exam: Physical Exam Vitals reviewed.  Constitutional:      Appearance: She is normal weight. She is  ill-appearing.  Neurological:     Mental Status: She is alert.  Motor: Weakness present.    Review of Systems  Psychiatric/Behavioral:         Psychotic   Blood pressure 106/76, pulse (!) 104, temperature (!) 97.3 F (36.3 C), temperature source Oral, resp. rate 16, height '5\' 4"'$  (1.626 m), weight 70.3 kg, SpO2 99 %. Body mass index is 26.61 kg/m.     Treatment Plan Summary: Daily contact with patient to assess and evaluate symptoms and progress in treatment and medication management   ASSESSMENT: Brief Psychotic Disorder with catatpnic features  History of MDD  Lavine Hargrove is a 22 y.o., female with a past psychiatric history significant for Major depressive disorder, recurrent episode ([place]), GAD, speech impediment, SI who presents to the [facility] Involuntary from behavioral health urgent care [facility]) for evaluation and management of psychosis concerning for brief psychotic disorder with AVH.   PLAN: Safety and Monitoring:             -- Involuntary admission to inpatient psychiatric unit for safety, stabilization and treatment             -- Daily contact with patient to assess and evaluate symptoms and progress in treatment             -- Patient's case to be discussed in multi-disciplinary team meeting             -- Observation Level : q15 minute checks             -- Vital signs:  q12 hours             -- Precautions: suicide, elopement, and assault   2. Medications:               Psychiatric Diagnosis and Treatment   -Change risperdal to all at bedtime. Current regimen is 0.5 mg at 0800, 0.5 mg at 1400, and 1 mg qhs. Plan will be to give 1 mg qhs tonight. Then next dose will be 2 mg qhs on Saturday night.  -Decrease ativan from 1.5 mg q8H -> to 1 mg in the morning, 1 mg at 1400, and 1.5 mg qhs - for catatonia  -D/c restoril 15 mg qhs prn - for insomnia  -Continue benztropine to 0.5 mg - for eps and drooling -continue atropine drops for drooling   -Continue lithium 300 mg every 12 hours for now -Previously tapered off Lexapro -Continue trazodone 50 mg qhs prn  -continue bowel regimen    Agitation Protocol Benadryl 50 mg TID IV and oral as needed for agitation Haldol 5 mg TID IV and oral as needed for agitation Ativan 2 mg TID IM and oral as needed for agitation   Patient does not need nicotine replacement   Medical Diagnosis and Treatment   Abnormal Urine Analysis - UA from the ED positive for large leukocytes, rare bacteria.  - Will repeat due to concern for UTI  Tachycardia -continue propranolol ER 60 mg once daily   Concern for constiaption -continue scheduled colace 100 mg bid  -continue miralax 17g daily   Sialorrhea -  -continue cogentin 1 mg bid -continue SL atropine drops    Other as needed medications  Tylenol 650 mg every 6 hours as needed for pain Mylanta 30 mL every 4 hours as needed for indigestion Milk of magnesia 30 mL daily as needed for constipation   3. Routine and other pertinent labs: EKG monitoring: QTc: 444   Metabolism / endocrine: BMI: Body mass index is 26.61 kg/m.  4. Group Therapy:             -- Encouraged patient to participate in unit milieu and in scheduled group therapies             -- We will address other chronic and acute stressors, which contributed to the patient's Brief psychotic disorder ([place]) in order to reduce the risk of self-harm at discharge.   5. Discharge Planning:              -- Social work and case management to assist with discharge planning and identification of hospital follow-up needs prior to discharge             -- Estimated LOS: 5-7 days             -- Discharge Concerns: Need to establish a safety plan; Medication compliance and effectiveness             -- Discharge Goals: Return home with outpatient referrals for mental health follow-up including medication management/psychotherapy   I certify that inpatient services furnished can reasonably  be expected to improve the patient's condition.    Christoper Allegra, MD 01/11/2023, 4:35 PM  Total Time Spent in Direct Patient Care:  I personally spent 35 minutes on the unit in direct patient care. The direct patient care time included face-to-face time with the patient, reviewing the patient's chart, communicating with other professionals, and coordinating care. Greater than 50% of this time was spent in counseling or coordinating care with the patient regarding goals of hospitalization, psycho-education, and discharge planning needs.   Janine Limbo, MD Psychiatrist

## 2023-01-11 NOTE — Plan of Care (Signed)
  Problem: Activity: Goal: Will verbalize the importance of balancing activity with adequate rest periods Outcome: Progressing   Problem: Education: Goal: Will be free of psychotic symptoms Outcome: Progressing   Problem: Coping: Goal: Coping ability will improve Outcome: Progressing Goal: Will verbalize feelings Outcome: Progressing   Problem: Health Behavior/Discharge Planning: Goal: Compliance with prescribed medication regimen will improve Outcome: Progressing   Problem: Nutritional: Goal: Ability to achieve adequate nutritional intake will improve Outcome: Progressing

## 2023-01-11 NOTE — BH IP Treatment Plan (Signed)
Interdisciplinary Treatment and Diagnostic Plan Update  01/11/2023 Time of Session: 8:30am, update Rebecca Hoover MRN: [id number]  Principal Diagnosis: Brief psychotic disorder [facility])  Secondary Diagnoses: Principal Problem:   Brief psychotic disorder ([place]) Active Problems:   Suicidal ideation   Speech impediment   Generalized anxiety disorder   Major depressive disorder, recurrent episode ([place])   Current Medications:  Current Facility-Administered Medications  Medication Dose Route Frequency Provider Last Rate Last Admin   acetaminophen (TYLENOL) tablet 650 mg  650 mg Oral Q6H PRN Evette Georges, NP   650 mg at 01/06/23 2007   alum & mag hydroxide-simeth (MAALOX/MYLANTA) 200-200-20 MG/5ML suspension 30 mL  30 mL Oral Q4H PRN Evette Georges, NP       atropine 1 % ophthalmic solution 1 drop  1 drop Sublingual TID Janine Limbo, MD   1 drop at 01/11/23 1216   benztropine (COGENTIN) tablet 0.5 mg  0.5 mg Oral BID Massengill, Ovid Curd, MD   0.5 mg at 01/11/23 0818   haloperidol (HALDOL) tablet 5 mg  5 mg Oral TID PRN Janine Limbo, MD   5 mg at 01/04/23 0300   And   LORazepam (ATIVAN) tablet 2 mg  2 mg Oral TID PRN Janine Limbo, MD       And   diphenhydrAMINE (BENADRYL) capsule 50 mg  50 mg Oral TID PRN Janine Limbo, MD   50 mg at 01/04/23 0300   haloperidol lactate (HALDOL) injection 5 mg  5 mg Intramuscular TID PRN Massengill, Ovid Curd, MD       And   LORazepam (ATIVAN) injection 2 mg  2 mg Intramuscular TID PRN Massengill, Ovid Curd, MD       And   diphenhydrAMINE (BENADRYL) injection 50 mg  50 mg Intramuscular TID PRN Massengill, Ovid Curd, MD       docusate sodium (COLACE) capsule 100 mg  100 mg Oral BID Massengill, Ovid Curd, MD   100 mg at 01/11/23 0818   feeding supplement (ENSURE ENLIVE / ENSURE PLUS) liquid 237 mL  237 mL Oral BID BM Nkwenti, Doris, NP   237 mL at 01/11/23 1020   hydrOXYzine (ATARAX) tablet 25 mg  25 mg Oral TID PRN Janine Limbo, MD   25 mg at  01/02/23 2355   lithium carbonate (LITHOBID) ER tablet 300 mg  300 mg Oral Q12H Coralyn Pear B, MD   300 mg at 01/11/23 0818   LORazepam (ATIVAN) tablet 1 mg  1 mg Oral Q6H PRN Massengill, Ovid Curd, MD       Or   LORazepam (ATIVAN) injection 1 mg  1 mg Intramuscular Q6H PRN Massengill, Ovid Curd, MD       LORazepam (ATIVAN) tablet 1.5 mg  1.5 mg Oral Q8H Massengill, Nathan, MD   1.5 mg at 01/11/23 1314   Or   LORazepam (ATIVAN) injection 1.5 mg  1.5 mg Intramuscular Q8H Massengill, Nathan, MD       magnesium hydroxide (MILK OF MAGNESIA) suspension 30 mL  30 mL Oral Daily PRN Evette Georges, NP   30 mL at 01/06/23 2007   norethindrone-ethinyl estradiol (LOESTRIN) 1-20 MG-MCG tablet 1 tablet  1 tablet Oral Daily Evette Georges, NP       polyethylene glycol (MIRALAX / GLYCOLAX) packet 17 g  17 g Oral Daily Massengill, Ovid Curd, MD   17 g at 01/11/23 0816   propranolol ER (INDERAL LA) 24 hr capsule 60 mg  60 mg Oral Daily Massengill, Ovid Curd, MD   60 mg at 01/11/23 6644   risperiDONE (RISPERDAL M-TABS)  disintegrating tablet 0.5 mg  0.5 mg Oral BID Massengill, Ovid Curd, MD   0.5 mg at 01/11/23 1313   risperiDONE (RISPERDAL M-TABS) disintegrating tablet 1 mg  1 mg Oral QHS Massengill, Nathan, MD   1 mg at 01/10/23 2054   temazepam (RESTORIL) capsule 15 mg  15 mg Oral QHS PRN Massengill, Ovid Curd, MD       traZODone (DESYREL) tablet 50 mg  50 mg Oral QHS PRN Massengill, Ovid Curd, MD       PTA Medications: Medications Prior to Admission  Medication Sig Dispense Refill Last Dose   norethindrone-ethinyl estradiol (LOESTRIN) 1-20 MG-MCG tablet Take 1 tablet by mouth daily. (Patient not taking: Reported on 12/30/2022)       Patient Stressors:    Patient Strengths:    Treatment Modalities: Medication Management, Group therapy, Case management,  1 to 1 session with clinician, Psychoeducation, Recreational therapy.   Physician Treatment Plan for Primary Diagnosis: Brief psychotic disorder ([place]) Long Term Goal(s):  Improvement in symptoms so as ready for discharge   Short Term Goals: Ability to identify changes in lifestyle to reduce recurrence of condition will improve Ability to verbalize feelings will improve Ability to disclose and discuss suicidal ideas Ability to identify and develop effective coping behaviors will improve  Medication Management: Evaluate patient's response, side effects, and tolerance of medication regimen.  Therapeutic Interventions: 1 to 1 sessions, Unit Group sessions and Medication administration.  Evaluation of Outcomes: Progressing  Physician Treatment Plan for Secondary Diagnosis: Principal Problem:   Brief psychotic disorder ([place]) Active Problems:   Suicidal ideation   Speech impediment   Generalized anxiety disorder   Major depressive disorder, recurrent episode ([place])  Long Term Goal(s): Improvement in symptoms so as ready for discharge   Short Term Goals: Ability to identify changes in lifestyle to reduce recurrence of condition will improve Ability to verbalize feelings will improve Ability to disclose and discuss suicidal ideas Ability to identify and develop effective coping behaviors will improve     Medication Management: Evaluate patient's response, side effects, and tolerance of medication regimen.  Therapeutic Interventions: 1 to 1 sessions, Unit Group sessions and Medication administration.  Evaluation of Outcomes: Progressing   RN Treatment Plan for Primary Diagnosis: Brief psychotic disorder ([place]) Long Term Goal(s): Knowledge of disease and therapeutic regimen to maintain health will improve  Short Term Goals: Ability to remain free from injury will improve, Ability to verbalize frustration and anger appropriately will improve, Ability to demonstrate self-control, Ability to participate in decision making will improve, Ability to verbalize feelings will improve, Ability to disclose and discuss suicidal ideas, Ability to identify and develop  effective coping behaviors will improve, and Compliance with prescribed medications will improve  Medication Management: RN will administer medications as ordered by provider, will assess and evaluate patient's response and provide education to patient for prescribed medication. RN will report any adverse and/or side effects to prescribing provider.  Therapeutic Interventions: 1 on 1 counseling sessions, Psychoeducation, Medication administration, Evaluate responses to treatment, Monitor vital signs and CBGs as ordered, Perform/monitor CIWA, COWS, AIMS and Fall Risk screenings as ordered, Perform wound care treatments as ordered.  Evaluation of Outcomes: Progressing   LCSW Treatment Plan for Primary Diagnosis: Brief psychotic disorder [facility]) Long Term Goal(s): Safe transition to appropriate next level of care at discharge, Engage patient in therapeutic group addressing interpersonal concerns.  Short Term Goals: Engage patient in aftercare planning with referrals and resources, Increase social support, Increase ability to appropriately verbalize feelings, Increase emotional regulation,  Facilitate acceptance of mental health diagnosis and concerns, Facilitate patient progression through stages of change regarding substance use diagnoses and concerns, Identify triggers associated with mental health/substance abuse issues, and Increase skills for wellness and recovery  Therapeutic Interventions: Assess for all discharge needs, 1 to 1 time with Social worker, Explore available resources and support systems, Assess for adequacy in community support network, Educate family and significant other(s) on suicide prevention, Complete Psychosocial Assessment, Interpersonal group therapy.  Evaluation of Outcomes: Progressing   Progress in Treatment: Attending groups: No. Participating in groups: No. Taking medication as prescribed: Yes. Toleration medication: No. Family/Significant other contact made: Yes,  individual(s) contacted:  mother Patient understands diagnosis: Yes. Discussing patient identified problems/goals with staff: Yes. Medical problems stabilized or resolved: Yes. Denies suicidal/homicidal ideation: Yes. Issues/concerns per patient self-inventory: No.   New problem(s) identified: No, Describe:  none reported   New Short Term/Long Term Goal(s):   medication stabilization, elimination of SI thoughts, development of comprehensive mental wellness plan.    Patient Goals:  Pt will continue to work on initial tx team goals, "I want to go back to school."  Care team will continue to assist and support her in her goals.   Discharge Plan or Barriers:  Patient will be connected to outpatient therapy and med management. No psychosocial barriers at this time.   Reason for Continuation of Hospitalization: Anxiety Delusions  Depression Mania Medication stabilization Other; describe psychosis  Estimated Length of Stay: 3-5 days  Last 3 [place] Suicide Severity Risk Score: [place] ED from 12/31/2022 in [facility] Emergency Department at [facility] ED from 12/29/2022 in [facility] ED from 01/20/2022 in [facility] Emergency Department at [place] No Risk No Risk No Risk       Last [facility] 2/9 Scores:     No data to display          Scribe for Treatment Team: Zachery Conch, LCSW 01/11/2023 1:19 PM

## 2023-01-12 DIAGNOSIS — F23 Brief psychotic disorder: Secondary | ICD-10-CM | POA: Diagnosis not present

## 2023-01-12 NOTE — Progress Notes (Signed)
Adult Psychoeducational Group Note  Date:  01/12/2023 Time:  9:10 PM  Group Topic/Focus:  Wrap-Up Group:   The focus of this group is to help patients review their daily goal of treatment and discuss progress on daily workbooks.  Participation Level:  Active  Participation Quality:  Appropriate  Affect:  Appropriate  Cognitive:  Appropriate  Insight: Appropriate  Engagement in Group:  Engaged  Modes of Intervention:  Education and Exploration  Additional Comments:  Patient attended and participated in group tonight. She reports that today that she don't always get to do what she want.  Salley Scarlet [facility] 01/12/2023, 9:10 PM

## 2023-01-12 NOTE — Progress Notes (Signed)
Pt has attended group and remains medication compliant. Pt in day room playing cards with peers at this time. Pt denies suicidal and homicidal thoughts as well as a/v hallucinations. Pt presented slightly sleepy this morning however is more awake this afternoon. Eating meals. Fluids encouraged. Q 15 minutes checks ongoing for safety.

## 2023-01-12 NOTE — Progress Notes (Signed)
   01/12/23 0541  15 Minute Checks  Location Bedroom  Visual Appearance Calm  Behavior Sleeping  Sleep (Behavioral Health Patients Only)  Calculate sleep? (Click Yes once per 24 hr at 0600 safety check) Yes  Documented sleep last 24 hours 13

## 2023-01-12 NOTE — Plan of Care (Signed)
  Problem: Coping: Goal: Coping ability will improve Outcome: Progressing   Problem: Health Behavior/Discharge Planning: Goal: Compliance with prescribed medication regimen will improve Outcome: Progressing   Problem: Nutritional: Goal: Ability to achieve adequate nutritional intake will improve Outcome: Progressing   Problem: Role Relationship: Goal: Ability to interact with others will improve Outcome: Progressing

## 2023-01-12 NOTE — Progress Notes (Addendum)
   On assessment, patient is observed visiting with her parent.  Patient presents with mild depression but is calmer and brighter today.  Patient denies SI/HI/AVH.  Medication was administered.  Patient was provided emotional support.  Patient is safe on the unit with Q 15 minute safety checks.

## 2023-01-12 NOTE — Group Note (Signed)
LCSW Group Therapy Note   No social work group was held; the following was provided to each patient  in lieu of in-person group:  Healthy vs. 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

## 2023-01-12 NOTE — Progress Notes (Addendum)
[facility] MD Progress Note  01/12/2023 11:13 AM Rebecca Hoover  MRN:  [id number]  Reason for Admission Rebecca Hoover is a 22 y.o. female with a history of  Major depressive disorder, recurrent episode (Rebecca Hoover), GAD, speech impediment, SI , who was initially admitted for inpatient psychiatric hospitalization on 01/01/2023 for management of brief psychotic disorder.   Subjective: On my assessment today, patient was able to converse appropriately.  She is speaking more.  Her speech more logical and organized but will occasionally mutter at times but this is likely due to speech impediment.  She is able to maintain control of saliva and no other complaints of sialorrhea.  She denies any AH.  She does not appear to be responding to internal stimuli. She denies SI/HI. She reports mild anxiety. She reports her sleep is better.  She is eating all of her meals.  According to nursing reports, the patient is doing much better over the last few days.  Patient inquired about her glasses as she does not have them and would like them to see better. No other concerns at this time.  Overall, patient is less psychotic. Catatonia has responded well to restarting and titrating Ativan.  She is still requiring psychiatric hospitalization for level of care if she would be unable to manage any ADLs and IADLs outside of the hospital.  Collateral: Rebecca Hoover (Mother)  Mother reports that patient appears to be doing much better than before.  Mother does report that patient appears still groggy at times but is overall much better than before.  When asked about patient's classes mother states that they are with her and her walker nightly.  Mother asked about ADHD/autism testing and really better yesterday. Groggy.  Mother asked if we did autism/ADHD testing while inpatient but I discussed with her that would be an outpatient neuropsychiatric testing and mother verbalized understanding.  Mother no other questions at this  time.  Principal Problem: Brief psychotic disorder ([place]) Diagnosis: Principal Problem:   Brief psychotic disorder ([place]) Active Problems:   Suicidal ideation   Speech impediment   Generalized anxiety disorder   Major depressive disorder, recurrent episode ([place])  Total Time spent with patient: 25 minutes  Past Psychiatric History:  Previous Psych Diagnoses: Major Depressive Disorder Prior psychiatric treatment: None Psychiatric medication compliance history: Compliant   Current psychiatric treatment:  Lexapro 20 mg daily for MDD, started around spring of 2023 Current psychiatrist: Last saw [facility] 6 months ago Current therapist: Denies per mother   Previous hospitalizations Denies per mother History of suicide attempts: Denies per mother History of self harm: Denies per mother  Past Medical History: No past medical history on file. No past surgical history on file. Family History: No family history on file.  Family Psychiatric  History:  Medical: Denies per mother Psych: 46 Sister-Bipolar Disorder Psych Rx: Unknown Suicide: Denies per mother Substance use family hx: Denies per mother  Social History:  Social History   Substance and Sexual Activity  Alcohol Use None     Social History   Substance and Sexual Activity  Drug Use Not on file    Social History   Socioeconomic History   Marital status: Widowed    Spouse name: Not on file   Number of children: Not on file   Years of education: Not on file   Highest education level: Not on file  Occupational History   Not on file  Tobacco Use   Smoking status: Unknown   Smokeless tobacco: Not on  file  Substance and Sexual Activity   Alcohol use: Not on file   Drug use: Not on file   Sexual activity: Not on file  Other Topics Concern   Not on file  Social History Narrative   ** Merged History Encounter **       Social Determinants of Health   Financial Resource Strain: Not on file  Food Insecurity: Not  on file  Transportation Needs: Not on file  Physical Activity: Not on file  Stress: Not on file  Social Connections: Not on file   Additional Social History:                         Sleep: Better  Appetite: Better  Current Medications: Current Facility-Administered Medications  Medication Dose Route Frequency Provider Last Rate Last Admin   acetaminophen (TYLENOL) tablet 650 mg  650 mg Oral Q6H PRN Evette Georges, NP   650 mg at 01/06/23 2007   alum & mag hydroxide-simeth (MAALOX/MYLANTA) 200-200-20 MG/5ML suspension 30 mL  30 mL Oral Q4H PRN Evette Georges, NP       atropine 1 % ophthalmic solution 1 drop  1 drop Sublingual TID Janine Limbo, MD   1 drop at 01/12/23 6599   benztropine (COGENTIN) tablet 0.5 mg  0.5 mg Oral BID Massengill, Ovid Curd, MD   0.5 mg at 01/12/23 3570   haloperidol (HALDOL) tablet 5 mg  5 mg Oral TID PRN Janine Limbo, MD   5 mg at 01/04/23 0300   And   LORazepam (ATIVAN) tablet 2 mg  2 mg Oral TID PRN Janine Limbo, MD       And   diphenhydrAMINE (BENADRYL) capsule 50 mg  50 mg Oral TID PRN Janine Limbo, MD   50 mg at 01/04/23 0300   haloperidol lactate (HALDOL) injection 5 mg  5 mg Intramuscular TID PRN Massengill, Ovid Curd, MD       And   LORazepam (ATIVAN) injection 2 mg  2 mg Intramuscular TID PRN Massengill, Ovid Curd, MD       And   diphenhydrAMINE (BENADRYL) injection 50 mg  50 mg Intramuscular TID PRN Massengill, Ovid Curd, MD       docusate sodium (COLACE) capsule 100 mg  100 mg Oral BID Massengill, Ovid Curd, MD   100 mg at 01/12/23 1779   feeding supplement (ENSURE ENLIVE / ENSURE PLUS) liquid 237 mL  237 mL Oral BID BM Nkwenti, Doris, NP   237 mL at 01/11/23 1453   hydrOXYzine (ATARAX) tablet 25 mg  25 mg Oral TID PRN Janine Limbo, MD   25 mg at 01/02/23 2355   lithium carbonate (LITHOBID) ER tablet 300 mg  300 mg Oral Q12H Coralyn Pear B, MD   300 mg at 01/12/23 3903   LORazepam (ATIVAN) tablet 1 mg  1 mg Oral Q6H PRN  Massengill, Ovid Curd, MD       Or   LORazepam (ATIVAN) injection 1 mg  1 mg Intramuscular Q6H PRN Massengill, Ovid Curd, MD       LORazepam (ATIVAN) tablet 1 mg  1 mg Oral BID Massengill, Nathan, MD   1 mg at 01/12/23 0092   LORazepam (ATIVAN) tablet 1.5 mg  1.5 mg Oral QHS Massengill, Nathan, MD   1.5 mg at 01/11/23 2123   magnesium hydroxide (MILK OF MAGNESIA) suspension 30 mL  30 mL Oral Daily PRN Evette Georges, NP   30 mL at 01/06/23 2007   norethindrone-ethinyl estradiol (LOESTRIN) 1-20 MG-MCG tablet 1  tablet  1 tablet Oral Daily Evette Georges, NP       polyethylene glycol (MIRALAX / GLYCOLAX) packet 17 g  17 g Oral Daily Massengill, Nathan, MD   17 g at 01/12/23 6754   propranolol ER (INDERAL LA) 24 hr capsule 60 mg  60 mg Oral Daily Massengill, Nathan, MD   60 mg at 01/12/23 4920   risperiDONE (RISPERDAL M-TABS) disintegrating tablet 2 mg  2 mg Oral QHS Massengill, Nathan, MD       traZODone (DESYREL) tablet 50 mg  50 mg Oral QHS PRN Massengill, Ovid Curd, MD        Lab Results:  No results found for this or any previous visit (from the past 48 hour(s)).   Blood Alcohol level:  Lab Results  Component Value Date   ETH <10 09/22/1218    Metabolic Disorder Labs: Lab Results  Component Value Date   HGBA1C 4.8 01/02/2023   MPG 91.06 01/02/2023   MPG 88.19 12/29/2022   No results found for: "PROLACTIN" Lab Results  Component Value Date   CHOL 128 01/02/2023   TRIG 34 01/02/2023   HDL 54 01/02/2023   CHOLHDL 2.4 01/02/2023   VLDL 7 01/02/2023   LDLCALC 67 01/02/2023   LDLCALC 88 12/29/2022    Physical Findings: AIMS: Facial and Oral Movements Muscles of Facial Expression: None, normal Lips and Perioral Area: None, normal Jaw: None, normal Tongue: None, normal,Extremity Movements Upper (arms, wrists, hands, fingers): None, normal Lower (legs, knees, ankles, toes): None, normal, Trunk Movements Neck, shoulders, hips: None, normal, Overall Severity Severity of abnormal  movements (highest score from questions above): None, normal Incapacitation due to abnormal movements: None, normal Patient's awareness of abnormal movements (rate only patient's report): No Awareness, Dental Status Current problems with teeth and/or dentures?: No Does patient usually wear dentures?: No  CIWA:    COWS:     Musculoskeletal: Strength & Muscle Tone: nml Gait & Station: Did not observe her gait today Patient leans: N/A  Psychiatric Specialty Exam:  Presentation  General Appearance:  Bizarre; Disheveled  Eye Contact: Minimal  Speech: Slurred; Garbled  Speech Volume: Decreased  Handedness: Right   Mood and Affect  Mood: Euthymic  Affect: Less constricted   Thought Process  Thought Processes: Less disorganized  Descriptions of Associations: More linear  Orientation:Full (Time, Place and Person)  Thought Content: No overt delusions or paranoid thought process present  History of Schizophrenia/Schizoaffective disorder:Yes  Duration of Psychotic Symptoms: Unclear, probably about 1 month  Hallucinations:No data recorded did not report any AH or VH Ideas of Reference: Unclear, patient had previously reported paranoia and delusions  Suicidal Thoughts:No data recorded denies Homicidal Thoughts:No data recorded denies  Sensorium  Memory: Remote Fair; Immediate Fair; Recent Fair  Judgment: Poor  Insight: Poor   Executive Functions  Concentration: Poor  Attention Span: Poor   Psychomotor Activity  Psychomotor Activity:No data recorded Some stiffness of upper extremities, no tremor or cogwheeling  Assets  Assets: Desire for Improvement; Resilience; Leisure Time; Social Support   Sleep  Sleep:No data recorded   Physical Exam: Physical Exam Vitals reviewed.  Constitutional:      Appearance: She is normal weight. She is ill-appearing.  Neurological:     Mental Status: She is alert.     Motor: Weakness present.    Review  of Systems  Psychiatric/Behavioral:         Psychotic   Blood pressure 109/68, pulse (!) 123, temperature (!) 97.3 F (36.3 C), temperature source  Oral, resp. rate 16, height '5\' 4"'$  (1.626 m), weight 70.3 kg, SpO2 100 %. Body mass index is 26.61 kg/m.     Treatment Plan Summary: Daily contact with patient to assess and evaluate symptoms and progress in treatment and medication management   ASSESSMENT: Brief Psychotic Disorder with catatpnic features  History of MDD  Krizia Flight is a 22 y.o., female with a past psychiatric history significant for Major depressive disorder, recurrent episode ([place]), GAD, speech impediment, SI who presents to the [facility] Involuntary from behavioral health urgent care [facility]) for evaluation and management of psychosis concerning for brief psychotic disorder with AVH.   PLAN: Safety and Monitoring:             -- Involuntary admission to inpatient psychiatric unit for safety, stabilization and treatment             -- Daily contact with patient to assess and evaluate symptoms and progress in treatment             -- Patient's case to be discussed in multi-disciplinary team meeting             -- Observation Level : q15 minute checks             -- Vital signs:  q12 hours             -- Precautions: suicide, elopement, and assault   2. Medications:               Psychiatric Diagnosis and Treatment   -Increase risperidone to 2 mg qhs on Saturday night.  -Decrease ativan to 1 mg tid for catatonia   -Consider lowering to 1 mg bid tomorrow given daytime sedation -Continue benztropine to 0.5 mg - for eps and drooling -continue atropine drops for drooling  -Continue lithium 300 mg every 12 hours -Previously tapered off Lexapro -Continue trazodone 50 mg qhs prn  -continue bowel regimen    Agitation Protocol Benadryl 50 mg TID IV and oral as needed for agitation Haldol 5 mg TID IV and oral as needed for agitation Ativan 2 mg TID IM  and oral as needed for agitation   Patient does not need nicotine replacement   Medical Diagnosis and Treatment   Abnormal Urine Analysis, resolved -UA repeat 11 days ago was normal  Tachycardia -continue propranolol ER 60 mg once daily   Concern for constiaption -continue scheduled colace 100 mg bid  -continue miralax 17g daily   Sialorrhea -  -continue cogentin 1 mg bid -continue SL atropine drops    Other as needed medications  Tylenol 650 mg every 6 hours as needed for pain Mylanta 30 mL every 4 hours as needed for indigestion Milk of magnesia 30 mL daily as needed for constipation   3. Routine and other pertinent labs: EKG monitoring: QTc: 444   Metabolism / endocrine: BMI: Body mass index is 26.61 kg/m.    4. Group Therapy:             -- Encouraged patient to participate in unit milieu and in scheduled group therapies             -- We will address other chronic and acute stressors, which contributed to the patient's Brief psychotic disorder ([place]) in order to reduce the risk of self-harm at discharge.   5. Discharge Planning:              -- Social work and case management to assist with discharge planning  and identification of hospital follow-up needs prior to discharge             -- Estimated LOS: 5-7 days             -- Discharge Concerns: Need to establish a safety plan; Medication compliance and effectiveness             -- Discharge Goals: Return home with outpatient referrals for mental health follow-up including medication management/psychotherapy   I certify that inpatient services furnished can reasonably be expected to improve the patient's condition.    France Ravens, MD 01/12/2023, 11:13 AM

## 2023-01-12 NOTE — [facility] Group Notes (Signed)
Group Focus: affirmation, clarity of thought, and goals/reality orientation Treatment Modality:  Psychoeducation Interventions utilized were assignment, group exercise, and support Purpose: To be able to understand and verbalize the reason for their admission to the hospital. To understand that the medication helps with their chemical imbalance but they also need to work on their choices in life. To be challenged to develop a list of 30 positives about themselves. Also introduce the concept that "feelings" are not reality.  Participation Level:  Active  Participation Quality:  Appropriate  Affect:  Appropriate  Cognitive:  Appropriate  Insight:  Improving  Engagement in Group:  Engaged  Additional Comments:  Rates her energy at a 10/10. Pt states she could not read the list that she wrote out for her positives because "I don't have my glasses". When she finally made the decision, to read the list, it was disorganized and did not make a lot of sense. States that the reason she is here is due to school.  Paulino Rily

## 2023-01-12 NOTE — [facility] Group Notes (Signed)
Adult Psychoeducational Group Note  Date:  01/12/2023 Time:  1:48 PM  Group Topic/Focus:  Goals Group:   The focus of this group is to help patients establish daily goals to achieve during treatment and discuss how the patient can incorporate goal setting into their daily lives to aide in recovery.  Participation Level:  Active  Participation Quality:  Appropriate  Affect:  Appropriate  Cognitive:  Appropriate  Insight: Appropriate  Engagement in Group:  Engaged  Modes of Intervention:  Education  Additional Comments:  Pt attended goals group. Pt goal today is to go to groups more and get out of room more. Pt was engaged and active during group. Pts nurse has been notified.   Rmani Kellogg-ulu J Dhruva Orndoff 01/12/2023, 1:48 PM

## 2023-01-12 NOTE — Progress Notes (Signed)
01/11/23 2123  Psych Admission Type (Psych Patients Only)  Admission Status Involuntary  Psychosocial Assessment  Patient Complaints Depression  Eye Contact Fair  Facial Expression Blank  Affect Depressed  Speech Soft;Slurred  Interaction Cautious  Motor Activity Slow  Appearance/Hygiene Disheveled  Behavior Characteristics Cooperative  Mood Pleasant;Depressed  Thought Process  Coherency Disorganized  Content UTA  Delusions None reported or observed  Perception UTA  Hallucination None reported or observed  Judgment Poor  Confusion Mild  Danger to Self  Current suicidal ideation? Denies  Danger to Others  Danger to Others None reported or observed

## 2023-01-12 NOTE — Progress Notes (Signed)
   01/12/23 2100  Psych Admission Type (Psych Patients Only)  Admission Status Voluntary  Psychosocial Assessment  Patient Complaints None  Eye Contact Brief  Facial Expression Blank  Affect Appropriate to circumstance  Speech Slurred;Soft  Interaction Cautious;Childlike  Motor Activity Slow  Appearance/Hygiene Disheveled  Behavior Characteristics Cooperative;Appropriate to situation  Mood Pleasant  Thought Process  Coherency Disorganized  Content UTA  Delusions None reported or observed  Perception UTA  Hallucination None reported or observed  Judgment Poor  Confusion Mild  Danger to Self  Current suicidal ideation? Denies

## 2023-01-13 MED ORDER — LORAZEPAM 1 MG PO TABS
1.0000 mg | ORAL_TABLET | Freq: Every day | ORAL | Status: DC
  Administered 2023-01-13 – 2023-01-17 (×5): 1 mg via ORAL
  Filled 2023-01-13 (×5): qty 1

## 2023-01-13 MED ORDER — LORAZEPAM 1 MG PO TABS
1.0000 mg | ORAL_TABLET | Freq: Every day | ORAL | Status: DC
  Administered 2023-01-13 – 2023-01-14 (×2): 1 mg via ORAL
  Filled 2023-01-13 (×3): qty 1

## 2023-01-13 NOTE — Progress Notes (Signed)
[facility] MD Progress Note  01/13/2023 7:47 AM Valary Manahan  MRN:  [id number]  Reason for Admission Brissia Delisa is a 22 y.o. female with a history of  Major depressive disorder, recurrent episode ([place]), GAD, speech impediment, SI , who was initially admitted for inpatient psychiatric hospitalization on 01/01/2023 for management of brief psychotic disorder.   Subjective: On my assessment today, patient was conversive and saying full sentences.  She reports fatigue related to not sleeping in her own bed as well as loud patients on 500 hall.  She also does suspect that the medication is causing some part of her fatigue at this point so we discussed decreasing frequency of Ativan and monitoring for return of catatonia.  She was agreeable to this.  She is speaking more and tries to be social but other patients are sometimes rude to her.  She denies SI/HI/AVH.  She reports her mood is good.  She reports her sleep is better.  She is eating all of her meals.  According to nursing reports, the patient is doing much better over the last few days.  Principal Problem: Brief psychotic disorder ([place]) Diagnosis: Principal Problem:   Brief psychotic disorder ([place]) Active Problems:   Suicidal ideation   Speech impediment   Generalized anxiety disorder   Major depressive disorder, recurrent episode ([place])  Total Time spent with patient: 25 minutes  Past Psychiatric History:  Previous Psych Diagnoses: Major Depressive Disorder Prior psychiatric treatment: None Psychiatric medication compliance history: Compliant   Current psychiatric treatment:  Lexapro 20 mg daily for MDD, started around spring of 2023 Current psychiatrist: Last saw [facility] 6 months ago Current therapist: Denies per mother   Previous hospitalizations Denies per mother History of suicide attempts: Denies per mother History of self harm: Denies per mother  Past Medical History: No past medical history on file. No past surgical history on  file. Family History: No family history on file.  Family Psychiatric  History:  Medical: Denies per mother Psych: 9 Sister-Bipolar Disorder Psych Rx: Unknown Suicide: Denies per mother Substance use family hx: Denies per mother  Social History:  Social History   Substance and Sexual Activity  Alcohol Use None     Social History   Substance and Sexual Activity  Drug Use Not on file    Social History   Socioeconomic History   Marital status: Widowed    Spouse name: Not on file   Number of children: Not on file   Years of education: Not on file   Highest education level: Not on file  Occupational History   Not on file  Tobacco Use   Smoking status: Unknown   Smokeless tobacco: Not on file  Substance and Sexual Activity   Alcohol use: Not on file   Drug use: Not on file   Sexual activity: Not on file  Other Topics Concern   Not on file  Social History Narrative   ** Merged History Encounter **       Social Determinants of Health   Financial Resource Strain: Not on file  Food Insecurity: Not on file  Transportation Needs: Not on file  Physical Activity: Not on file  Stress: Not on file  Social Connections: Not on file   Additional Social History:                         Sleep: Better  Appetite: Better  Current Medications: Current Facility-Administered Medications  Medication Dose Route Frequency Provider  Last Rate Last Admin   acetaminophen (TYLENOL) tablet 650 mg  650 mg Oral Q6H PRN Evette Georges, NP   650 mg at 01/06/23 2007   alum & mag hydroxide-simeth (MAALOX/MYLANTA) 200-200-20 MG/5ML suspension 30 mL  30 mL Oral Q4H PRN Evette Georges, NP       atropine 1 % ophthalmic solution 1 drop  1 drop Sublingual TID Janine Limbo, MD   1 drop at 01/12/23 1707   benztropine (COGENTIN) tablet 0.5 mg  0.5 mg Oral BID Massengill, Ovid Curd, MD   0.5 mg at 01/12/23 1707   haloperidol (HALDOL) tablet 5 mg  5 mg Oral TID PRN Janine Limbo, MD    5 mg at 01/04/23 0300   And   LORazepam (ATIVAN) tablet 2 mg  2 mg Oral TID PRN Janine Limbo, MD       And   diphenhydrAMINE (BENADRYL) capsule 50 mg  50 mg Oral TID PRN Janine Limbo, MD   50 mg at 01/04/23 0300   haloperidol lactate (HALDOL) injection 5 mg  5 mg Intramuscular TID PRN Janine Limbo, MD       And   LORazepam (ATIVAN) injection 2 mg  2 mg Intramuscular TID PRN Janine Limbo, MD       And   diphenhydrAMINE (BENADRYL) injection 50 mg  50 mg Intramuscular TID PRN Massengill, Ovid Curd, MD       docusate sodium (COLACE) capsule 100 mg  100 mg Oral BID Massengill, Ovid Curd, MD   100 mg at 01/12/23 1707   feeding supplement (ENSURE ENLIVE / ENSURE PLUS) liquid 237 mL  237 mL Oral BID BM Nkwenti, Doris, NP   237 mL at 01/12/23 1352   hydrOXYzine (ATARAX) tablet 25 mg  25 mg Oral TID PRN Janine Limbo, MD   25 mg at 01/02/23 2355   lithium carbonate (LITHOBID) ER tablet 300 mg  300 mg Oral Q12H Coralyn Pear B, MD   300 mg at 01/12/23 2059   LORazepam (ATIVAN) tablet 1 mg  1 mg Oral Q6H PRN Massengill, Ovid Curd, MD       Or   LORazepam (ATIVAN) injection 1 mg  1 mg Intramuscular Q6H PRN Massengill, Ovid Curd, MD       LORazepam (ATIVAN) tablet 1 mg  1 mg Oral QHS France Ravens, MD       LORazepam (ATIVAN) tablet 1 mg  1 mg Oral Q1200 France Ravens, MD       magnesium hydroxide (MILK OF MAGNESIA) suspension 30 mL  30 mL Oral Daily PRN Evette Georges, NP   30 mL at 01/06/23 2007   norethindrone-ethinyl estradiol (LOESTRIN) 1-20 MG-MCG tablet 1 tablet  1 tablet Oral Daily Evette Georges, NP       polyethylene glycol (MIRALAX / GLYCOLAX) packet 17 g  17 g Oral Daily Massengill, Ovid Curd, MD   17 g at 01/12/23 9326   propranolol ER (INDERAL LA) 24 hr capsule 60 mg  60 mg Oral Daily Massengill, Ovid Curd, MD   60 mg at 01/12/23 7124   risperiDONE (RISPERDAL M-TABS) disintegrating tablet 2 mg  2 mg Oral QHS Massengill, Nathan, MD   2 mg at 01/12/23 2100   traZODone (DESYREL) tablet 50 mg   50 mg Oral QHS PRN Massengill, Ovid Curd, MD        Lab Results:  No results found for this or any previous visit (from the past 48 hour(s)).   Blood Alcohol level:  Lab Results  Component Value Date   ETH <10 12/29/2022  Metabolic Disorder Labs: Lab Results  Component Value Date   HGBA1C 4.8 01/02/2023   MPG 91.06 01/02/2023   MPG 88.19 12/29/2022   No results found for: "PROLACTIN" Lab Results  Component Value Date   CHOL 128 01/02/2023   TRIG 34 01/02/2023   HDL 54 01/02/2023   CHOLHDL 2.4 01/02/2023   VLDL 7 01/02/2023   LDLCALC 67 01/02/2023   LDLCALC 88 12/29/2022    Physical Findings: AIMS: Facial and Oral Movements Muscles of Facial Expression: None, normal Lips and Perioral Area: None, normal Jaw: None, normal Tongue: None, normal,Extremity Movements Upper (arms, wrists, hands, fingers): None, normal Lower (legs, knees, ankles, toes): None, normal, Trunk Movements Neck, shoulders, hips: None, normal, Overall Severity Severity of abnormal movements (highest score from questions above): None, normal Incapacitation due to abnormal movements: None, normal Patient's awareness of abnormal movements (rate only patient's report): No Awareness, Dental Status Current problems with teeth and/or dentures?: No Does patient usually wear dentures?: No  CIWA:    COWS:     Musculoskeletal: Strength & Muscle Tone: nml Gait & Station: Did not observe her gait today Patient leans: N/A  Psychiatric Specialty Exam:  Presentation  General Appearance:  Bizarre; Disheveled  Eye Contact: Minimal  Speech: Slurred; Garbled  Speech Volume: Decreased  Handedness: Right   Mood and Affect  Mood: Euthymic  Affect: Less constricted   Thought Process  Thought Processes: Less disorganized  Descriptions of Associations: More linear  Orientation:Full (Time, Place and Person)  Thought Content: No overt delusions or paranoid thought process present  History  of Schizophrenia/Schizoaffective disorder:Yes  Duration of Psychotic Symptoms: Unclear, probably about 1 month  Hallucinations:No data recorded did not report any AH or VH Ideas of Reference: Unclear, patient had previously reported paranoia and delusions  Suicidal Thoughts:No data recorded denies Homicidal Thoughts:No data recorded denies  Sensorium  Memory: Remote Fair; Immediate Fair; Recent Fair  Judgment: Poor  Insight: Poor   Executive Functions  Concentration: Poor  Attention Span: Poor   Psychomotor Activity  Psychomotor Activity:No data recorded Some stiffness of upper extremities, no tremor or cogwheeling  Assets  Assets: Desire for Improvement; Resilience; Leisure Time; Social Support   Sleep  Sleep:No data recorded   Physical Exam: Physical Exam Vitals reviewed.  Constitutional:      Appearance: She is normal weight. She is not ill-appearing.  Neurological:     Mental Status: She is alert.     Motor: Weakness present.    Review of Systems  Psychiatric/Behavioral:         Psychotic   Blood pressure 105/81, pulse (!) 112, temperature 98.2 F (36.8 C), temperature source Oral, resp. rate 16, height '5\' 4"'$  (1.626 m), weight 70.3 kg, SpO2 98 %. Body mass index is 26.61 kg/m.     Treatment Plan Summary: Daily contact with patient to assess and evaluate symptoms and progress in treatment and medication management   ASSESSMENT: Brief Psychotic Disorder with catatpnic features  History of MDD  Willer Osorno is a 22 y.o., female with a past psychiatric history significant for Major depressive disorder, recurrent episode ([place]), GAD, speech impediment, SI who presents to the [facility] Involuntary from behavioral health urgent care [facility]) for evaluation and management of psychosis concerning for brief psychotic disorder with AVH.   PLAN: Safety and Monitoring:             -- Involuntary admission to inpatient psychiatric unit  for safety, stabilization and treatment             --  Daily contact with patient to assess and evaluate symptoms and progress in treatment             -- Patient's case to be discussed in multi-disciplinary team meeting             -- Observation Level : q15 minute checks             -- Vital signs:  q12 hours             -- Precautions: suicide, elopement, and assault   2. Medications:               Psychiatric Diagnosis and Treatment   -Continue risperidone to 2 mg qhs on Saturday night.  -Decrease ativan to 1 mg bid for catatonia  -Continue benztropine to 0.5 mg - for eps and drooling -continue atropine drops for drooling  -Continue lithium 300 mg every 12 hours -Previously tapered off Lexapro -Continue trazodone 50 mg qhs prn  -continue bowel regimen    Agitation Protocol Benadryl 50 mg TID IV and oral as needed for agitation Haldol 5 mg TID IV and oral as needed for agitation Ativan 2 mg TID IM and oral as needed for agitation   Patient does not need nicotine replacement   Medical Diagnosis and Treatment   Abnormal Urine Analysis, resolved -UA repeat 11 days ago was normal  Tachycardia -continue propranolol ER 60 mg once daily   Concern for constiaption -continue scheduled colace 100 mg bid  -continue miralax 17g daily   Sialorrhea -  -continue cogentin 1 mg bid -continue SL atropine drops    Other as needed medications  Tylenol 650 mg every 6 hours as needed for pain Mylanta 30 mL every 4 hours as needed for indigestion Milk of magnesia 30 mL daily as needed for constipation   3. Routine and other pertinent labs: EKG monitoring: QTc: 444   Metabolism / endocrine: BMI: Body mass index is 26.61 kg/m.    4. Group Therapy:             -- Encouraged patient to participate in unit milieu and in scheduled group therapies             -- We will address other chronic and acute stressors, which contributed to the patient's Brief psychotic disorder ([place]) in order  to reduce the risk of self-harm at discharge.   5. Discharge Planning:              -- Social work and case management to assist with discharge planning and identification of hospital follow-up needs prior to discharge             -- Estimated LOS: 5-7 days             -- Discharge Concerns: Need to establish a safety plan; Medication compliance and effectiveness             -- Discharge Goals: Return home with outpatient referrals for mental health follow-up including medication management/psychotherapy   I certify that inpatient services furnished can reasonably be expected to improve the patient's condition.    France Ravens, MD 01/13/2023, 7:47 AM

## 2023-01-13 NOTE — Progress Notes (Signed)
Adult Psychoeducational Group Note  Date:  01/13/2023 Time:  10:08 PM  Group Topic/Focus:  Wrap-Up Group:   The focus of this group is to help patients review their daily goal of treatment and discuss progress on daily workbooks.  Participation Level:  Active  Participation Quality:  Appropriate  Affect:  Appropriate  Cognitive:  Appropriate  Insight: Improving  Engagement in Group:  Engaged  Modes of Intervention:  Education and Exploration  Additional Comments:  Patient attended and participated in group tonight. She reports that she had a good day. Her mother visited with her today  Rebecca Hoover 01/13/2023, 10:08 PM

## 2023-01-13 NOTE — [facility] Group Notes (Signed)
Adult Psychoeducational Group Note Date:  01/13/2023 Time:  0900-1000 Group Topic/Focus: PROGRESSIVE RELAXATION. A group where deep breathing is taught and tensing and relaxation muscle groups is used. Imagery is used as well.  Pts are asked to imagine 3 pillars that hold them up when they are not able to hold themselves up and to share that with the group.   Participation Level:  Active  Participation Quality:  Appropriate  Affect:  Appropriate  Cognitive:  Approprate  Insight: Improving  Engagement in Group:  Engaged  Modes of Intervention:  deep breathing, Imagery. Discussion  Additional Comments:  Rates energy at an 8/10. States what helps her is to keep to herself, stay quiet.   : Rebecca Hoover

## 2023-01-13 NOTE — Progress Notes (Signed)
   01/13/23 0615  15 Minute Checks  Location Bedroom  Visual Appearance Calm  Behavior Composed  Sleep (Behavioral Health Patients Only)  Calculate sleep? (Click Yes once per 24 hr at 0600 safety check) Yes  Documented sleep last 24 hours 6.5

## 2023-01-13 NOTE — Progress Notes (Signed)
Adult Psychoeducational Group Note  Date:  01/13/2023 Time:  10:48 AM  Group Topic/Focus:  Goals Group:   The focus of this group is to help patients establish daily goals to achieve during treatment and discuss how the patient can incorporate goal setting into their daily lives to aide in recovery.  Participation Level:  Active  Participation Quality:  Appropriate  Affect:  Appropriate  Cognitive:  Appropriate  Insight: Appropriate  Engagement in Group:  Engaged  Modes of Intervention:  Discussion  Additional Comments:  Patient attended morning orientation/goal group and participated  Jeannette Maddy W Mariateresa Batra 08/[birth year], 10:48 AM

## 2023-01-14 MED ORDER — TEMAZEPAM 15 MG PO CAPS
15.0000 mg | ORAL_CAPSULE | Freq: Every day | ORAL | Status: DC
  Administered 2023-01-14 – 2023-01-17 (×4): 15 mg via ORAL
  Filled 2023-01-14 (×4): qty 1

## 2023-01-14 MED ORDER — LORAZEPAM 0.5 MG PO TABS
0.5000 mg | ORAL_TABLET | Freq: Two times a day (BID) | ORAL | Status: DC
  Administered 2023-01-15 – 2023-01-16 (×4): 0.5 mg via ORAL
  Filled 2023-01-14 (×4): qty 1

## 2023-01-14 MED ORDER — BENZTROPINE MESYLATE 0.5 MG PO TABS
0.5000 mg | ORAL_TABLET | Freq: Every day | ORAL | Status: DC
  Administered 2023-01-15 – 2023-01-18 (×4): 0.5 mg via ORAL
  Filled 2023-01-14 (×6): qty 1

## 2023-01-14 MED ORDER — RISPERIDONE 0.5 MG PO TBDP
2.5000 mg | ORAL_TABLET | Freq: Every day | ORAL | Status: DC
  Administered 2023-01-14 – 2023-01-17 (×4): 2.5 mg via ORAL
  Filled 2023-01-14 (×6): qty 5

## 2023-01-14 NOTE — Progress Notes (Signed)
Adult Psychoeducational Group Note  Date:  01/14/2023 Time:  8:21 PM  Group Topic/Focus:  Wrap-Up Group:   The focus of this group is to help patients review their daily goal of treatment and discuss progress on daily workbooks.  Participation Level:  Active  Participation Quality:  Intrusive and Redirectable  Affect:  Flat  Cognitive:  Disorganized, Delusional, and Lacking  Insight: None  Engagement in Group:  Distracting and Off Topic  Modes of Intervention:  Discussion  Additional Comments:   When asked about her day pt stated a bunch of words that the writer didn't understand. Pt endorsed anxiety, depression, SI, HI, and AVH.  Pt wasn't listening to Probation officer at this point.   Gerhard Perches 01/14/2023, 8:21 PM

## 2023-01-14 NOTE — Progress Notes (Signed)
   01/14/23 0915  Psych Admission Type (Psych Patients Only)  Admission Status Involuntary  Psychosocial Assessment  Patient Complaints Sadness  Eye Contact Brief  Facial Expression Sad  Affect Depressed;Sad  Speech Slurred  Interaction Childlike  Motor Activity Slow  Appearance/Hygiene Unremarkable  Behavior Characteristics Cooperative  Mood Depressed;Sad  Thought Process  Coherency Disorganized  Content Delusions  Delusions None reported or observed  Perception Hallucinations  Hallucination Visual  Judgment WDL  Confusion Mild  Danger to Self  Current suicidal ideation? Denies  Danger to Others  Danger to Others None reported or observed   Pt presents as disorganized and her speech is difficult to understand at times.  Pt took medications without incident and no adverse reactions are noted.  Pt denies SI/HI/AVH. RN will continue to assess for needs and concerns and monitor pt's progress.

## 2023-01-14 NOTE — Progress Notes (Signed)
Pt requesting pain medicine for ear pain and headache. Pt verbalizing she has trouble hearing; however, she is able to answer hear me when I ask questions. Pt states she frequently gets ear pain since she was a little girl.   Pt given PRN acetaminophen for pain. Patient informed to notify staff with problems or concerns.   R- Patient compliant with medication.

## 2023-01-14 NOTE — Progress Notes (Signed)
[facility] MD Progress Note  01/14/2023 3:53 PM Rebecca Hoover  MRN:  [id number]  Reason for Admission Rebecca Hoover is a 22 y.o. female with a history of  Major depressive disorder, recurrent episode ([place]), GAD, speech impediment, SI , who was initially admitted for inpatient psychiatric hospitalization on 01/01/2023 for management of brief psychotic disorder.   Subjective: On my assessment today, patient is some more disorganized that described in documentation from yesterday.  She is able to speak in full sentences.  She appears less sedated since my last evaluation on Friday, 3 days ago.  She denies any auditory hallucinations.  Denies any paranoid thinking.  Denies other psychotic symptoms (her primary psychotic symptoms as disorganized and bizarre thoughts).  Today, the patient would like this doctor to know that her heat is not working and smells like pot, she asked the psychiatrist that there is a pool in the hospital.  When she is told there is not, she then asked if there is an insightful.  She states that she likes to dance and read books, and that is the reason she is looking for a pool. She is unable to abstract.,  When asked about the similarities between a clock and a ruler, she states they both have minutes.  When asked about the similarity between a bus and a bike, she said that bikes her faster than buses.  She otherwise denies any SI, HI. Catatonia does not appear to have recurred with decreasing Ativan dose.  She reports that appetite is good.  She does report that sleep is good, and nursing documentation supports this. C/o dry mouth now and does not report any drooling.    Principal Problem: Brief psychotic disorder ([place]) Diagnosis: Principal Problem:   Brief psychotic disorder ([place]) Active Problems:   Suicidal ideation   Speech impediment   Generalized anxiety disorder   Major depressive disorder, recurrent episode ([place])  Total Time spent with patient: 25 minutes  Past  Psychiatric History:  Previous Psych Diagnoses: Major Depressive Disorder Prior psychiatric treatment: None Psychiatric medication compliance history: Compliant   Current psychiatric treatment:  Lexapro 20 mg daily for MDD, started around spring of 2023 Current psychiatrist: Last saw [facility] 6 months ago Current therapist: Denies per mother   Previous hospitalizations Denies per mother History of suicide attempts: Denies per mother History of self harm: Denies per mother  Past Medical History: No past medical history on file. No past surgical history on file. Family History: No family history on file.  Family Psychiatric  History:  Medical: Denies per mother Psych: 89 Sister-Bipolar Disorder Psych Rx: Unknown Suicide: Denies per mother Substance use family hx: Denies per mother  Social History:  Social History   Substance and Sexual Activity  Alcohol Use None     Social History   Substance and Sexual Activity  Drug Use Not on file    Social History   Socioeconomic History   Marital status: Widowed    Spouse name: Not on file   Number of children: Not on file   Years of education: Not on file   Highest education level: Not on file  Occupational History   Not on file  Tobacco Use   Smoking status: Unknown   Smokeless tobacco: Not on file  Substance and Sexual Activity   Alcohol use: Not on file   Drug use: Not on file   Sexual activity: Not on file  Other Topics Concern   Not on file  Social History Narrative   **  Merged History Encounter **       Social Determinants of Health   Financial Resource Strain: Not on file  Food Insecurity: Not on file  Transportation Needs: Not on file  Physical Activity: Not on file  Stress: Not on file  Social Connections: Not on file   Additional Social History:                         Sleep: poor  Appetite: Better  Current Medications: Current Facility-Administered Medications  Medication Dose Route  Frequency Provider Last Rate Last Admin   acetaminophen (TYLENOL) tablet 650 mg  650 mg Oral Q6H PRN Rebecca Georges, NP   650 mg at 01/14/23 1048   alum & mag hydroxide-simeth (MAALOX/MYLANTA) 200-200-20 MG/5ML suspension 30 mL  30 mL Oral Q4H PRN Rebecca Georges, NP       Rebecca Hoover ON 01/15/2023] benztropine (COGENTIN) tablet 0.5 mg  0.5 mg Oral Daily Rebecca Hoover, Rebecca Curd, MD       haloperidol (HALDOL) tablet 5 mg  5 mg Oral TID PRN Rebecca Limbo, MD   5 mg at 01/04/23 0300   And   LORazepam (ATIVAN) tablet 2 mg  2 mg Oral TID PRN Rebecca Limbo, MD       And   diphenhydrAMINE (BENADRYL) capsule 50 mg  50 mg Oral TID PRN Rebecca Limbo, MD   50 mg at 01/04/23 0300   haloperidol lactate (HALDOL) injection 5 mg  5 mg Intramuscular TID PRN Rebecca Hoover, Rebecca Curd, MD       And   LORazepam (ATIVAN) injection 2 mg  2 mg Intramuscular TID PRN Rebecca Hoover, Rebecca Curd, MD       And   diphenhydrAMINE (BENADRYL) injection 50 mg  50 mg Intramuscular TID PRN Rebecca Hoover, Rebecca Curd, MD       docusate sodium (COLACE) capsule 100 mg  100 mg Oral BID Rebecca Hoover, Rebecca Curd, MD   100 mg at 01/14/23 0751   feeding supplement (ENSURE ENLIVE / ENSURE PLUS) liquid 237 mL  237 mL Oral BID BM Rebecca Hoover, Doris, NP   237 mL at 01/14/23 1339   hydrOXYzine (ATARAX) tablet 25 mg  25 mg Oral TID PRN Rebecca Limbo, MD   25 mg at 01/02/23 2355   lithium carbonate (LITHOBID) ER tablet 300 mg  300 mg Oral Q12H Rebecca Pear B, MD   300 mg at 01/14/23 0751   LORazepam (ATIVAN) tablet 1 mg  1 mg Oral Q6H PRN Rebecca Hoover, Rebecca Curd, MD       Or   LORazepam (ATIVAN) injection 1 mg  1 mg Intramuscular Q6H PRN Rebecca Hoover, Rebecca Curd, MD       Rebecca Hoover ON 01/15/2023] LORazepam (ATIVAN) tablet 0.5 mg  0.5 mg Oral BID Rebecca Tweten, MD       LORazepam (ATIVAN) tablet 1 mg  1 mg Oral QHS Rebecca Ravens, MD   1 mg at 01/13/23 2101   magnesium hydroxide (MILK OF MAGNESIA) suspension 30 mL  30 mL Oral Daily PRN Rebecca Georges, NP   30 mL at 01/06/23 2007    norethindrone-ethinyl estradiol (LOESTRIN) 1-20 MG-MCG tablet 1 tablet  1 tablet Oral Daily Rebecca Georges, NP       polyethylene glycol (MIRALAX / GLYCOLAX) packet 17 g  17 g Oral Daily Ngina Royer, Rebecca Curd, MD   17 g at 01/14/23 0751   propranolol ER (INDERAL LA) 24 hr capsule 60 mg  60 mg Oral Daily Mehak Roskelley, Rebecca Curd, MD   60 mg at 01/14/23 0751   risperiDONE (  RISPERDAL M-TABS) disintegrating tablet 2.5 mg  2.5 mg Oral QHS Malikah Lakey, MD       temazepam (RESTORIL) capsule 15 mg  15 mg Oral QHS Manford Sprong, MD       traZODone (DESYREL) tablet 50 mg  50 mg Oral QHS PRN Rector Devonshire, Rebecca Curd, MD        Lab Results:  No results found for this or any previous visit (from the past 48 hour(s)).   Blood Alcohol level:  Lab Results  Component Value Date   ETH <10 57/47/3403    Metabolic Disorder Labs: Lab Results  Component Value Date   HGBA1C 4.8 01/02/2023   MPG 91.06 01/02/2023   MPG 88.19 12/29/2022   No results found for: "PROLACTIN" Lab Results  Component Value Date   CHOL 128 01/02/2023   TRIG 34 01/02/2023   HDL 54 01/02/2023   CHOLHDL 2.4 01/02/2023   VLDL 7 01/02/2023   LDLCALC 67 01/02/2023   LDLCALC 88 12/29/2022    Physical Findings: AIMS: Facial and Oral Movements Muscles of Facial Expression: None, normal Lips and Perioral Area: None, normal Jaw: None, normal Tongue: None, normal,Extremity Movements Upper (arms, wrists, hands, fingers): None, normal Lower (legs, knees, ankles, toes): None, normal, Trunk Movements Neck, shoulders, hips: None, normal, Overall Severity Severity of abnormal movements (highest score from questions above): None, normal Incapacitation due to abnormal movements: None, normal Patient's awareness of abnormal movements (rate only patient's report): No Awareness, Dental Status Current problems with teeth and/or dentures?: No Does patient usually wear dentures?: No  CIWA:    COWS:     NO eps today  01-14-23   Musculoskeletal: Strength & Muscle Tone: nml Gait & Station: nml Patient leans: N/A  Psychiatric Specialty Exam:  Presentation  General Appearance:  Bizarre; Disheveled  Eye Contact: Minimal  Speech: Slurred; Garbled  Speech Volume: Decreased  Handedness: Right   Mood and Affect  Mood: anxious  Affect: Less constricted   Thought Process  Thought Processes: Less disorganized  Descriptions of Associations: More linear  Orientation:Full (Time, Place and Person)  Thought Content: No overt delusions or paranoid thought process present  History of Schizophrenia/Schizoaffective disorder:Yes  Duration of Psychotic Symptoms: Unclear, probably about 1 month  Hallucinations:No data recorded did not report any AH or VH Ideas of Reference: Unclear, patient had previously reported paranoia and delusions  Suicidal Thoughts:No data recorded denies Homicidal Thoughts:No data recorded denies  Sensorium  Memory: Remote Fair; Immediate Fair; Recent Fair  Judgment: Poor  Insight: Poor   Executive Functions  Concentration: Poor  Attention Span: Poor   Psychomotor Activity  Psychomotor Activity:No data recorded Some stiffness of upper extremities, no tremor or cogwheeling  Assets  Assets: Desire for Improvement; Resilience; Leisure Time; Social Support   Sleep  Sleep:No data recorded   Physical Exam: Physical Exam Vitals reviewed.  Constitutional:      Appearance: She is normal weight. She is not ill-appearing.  Neurological:     Mental Status: She is alert.     Motor: Weakness present.    Review of Systems  Psychiatric/Behavioral:         Psychotic   Blood pressure 107/62, pulse 87, temperature 97.7 F (36.5 C), temperature source Oral, resp. rate 16, height '5\' 4"'$  (1.626 m), weight 70.3 kg, SpO2 100 %. Body mass index is 26.61 kg/m.     Treatment Plan Summary: Daily contact with patient to assess and evaluate symptoms and  progress in treatment and medication management   ASSESSMENT: Brief  Psychotic Disorder with catatpnic features  History of MDD  Threasa Kinch is a 22 y.o., female with a past psychiatric history significant for Major depressive disorder, recurrent episode ([place]), GAD, speech impediment, SI who presents to the [facility] Involuntary from behavioral health urgent care [facility]) for evaluation and management of psychosis concerning for brief psychotic disorder with AVH.   PLAN: Safety and Monitoring:             -- Involuntary admission to inpatient psychiatric unit for safety, stabilization and treatment             -- Daily contact with patient to assess and evaluate symptoms and progress in treatment             -- Patient's case to be discussed in multi-disciplinary team meeting             -- Observation Level : q15 minute checks             -- Vital signs:  q12 hours             -- Precautions: suicide, elopement, and assault   2. Medications:               Psychiatric Diagnosis and Treatment   -INCREASE risperidone from 2 mg to 2.5 mg qhs- for psychosis -CHANGE ativan from 1 mg bid -> to 0.5 mg / 0.5 mg/ 1 mg - for catatonia  -DECREASE benztropine from 0.5 mg bid to 0.5 mg qd - for eps and drooling -d/c atropine drops for drooling  -Continue lithium 300 mg every 12 hours -Restart restoril 15 mg qhs for insomnia  -Previously tapered off Lexapro -Continue trazodone 50 mg qhs prn  -continue bowel regimen    Agitation Protocol Benadryl 50 mg TID IV and oral as needed for agitation Haldol 5 mg TID IV and oral as needed for agitation Ativan 2 mg TID IM and oral as needed for agitation   Patient does not need nicotine replacement   Medical Diagnosis and Treatment   Abnormal Urine Analysis, resolved -UA repeat 11 days ago was normal  Tachycardia -continue propranolol ER 60 mg once daily   Concern for constiaption -continue scheduled colace 100 mg bid   -continue miralax 17g daily   Sialorrhea -  -continue cogentin 1 mg bid -continue SL atropine drops    Other as needed medications  Tylenol 650 mg every 6 hours as needed for pain Mylanta 30 mL every 4 hours as needed for indigestion Milk of magnesia 30 mL daily as needed for constipation   3. Routine and other pertinent labs: EKG monitoring: QTc: 444   Metabolism / endocrine: BMI: Body mass index is 26.61 kg/m.    4. Group Therapy:             -- Encouraged patient to participate in unit milieu and in scheduled group therapies             -- We will address other chronic and acute stressors, which contributed to the patient's Brief psychotic disorder ([place]) in order to reduce the risk of self-harm at discharge.   5. Discharge Planning:              -- Social work and case management to assist with discharge planning and identification of hospital follow-up needs prior to discharge             -- Estimated LOS: 5-7 days             --  Discharge Concerns: Need to establish a safety plan; Medication compliance and effectiveness             -- Discharge Goals: Return home with outpatient referrals for mental health follow-up including medication management/psychotherapy   I certify that inpatient services furnished can reasonably be expected to improve the patient's condition.    Christoper Allegra, MD 01/14/2023, 3:53 PM  Total Time Spent in Direct Patient Care:  I personally spent 35 minutes on the unit in direct patient care. The direct patient care time included face-to-face time with the patient, reviewing the patient's chart, communicating with other professionals, and coordinating care. Greater than 50% of this time was spent in counseling or coordinating care with the patient regarding goals of hospitalization, psycho-education, and discharge planning needs.   Rebecca Limbo, MD Psychiatrist

## 2023-01-14 NOTE — Group Note (Signed)
Recreation Therapy Group Note   Group Topic:Goal Setting  Group Date: 01/14/2023 Start Time: 1000 End Time: 1026 Facilitators: Kalven Ganim-McCall, LRT,CTRS Location: [address] Dayroom   Goal Area(s) Addresses:  Patient will identify what a goal is. Patient will identify goals to work towards post d/c. Patient will identify the importance of goals.  Group Description: Setting Goals. Patients were given a worksheet that focused on goals.  Patients were to identify their priorities for the future.  Patients were to then identify what they tell themselves when they want to give up.  Lastly, patients were to identify 5 goals they was to accomplish post discharge.  Patients would then identify the action steps that need to be taken to reach said goals and a deadline for reaching those goals.    Affect/Mood: Appropriate   Participation Level: Active   Participation Quality: Independent   Behavior: Appropriate and Cooperative   Speech/Thought Process: Disorganized   Insight: Lacking   Judgement: Lacking    Modes of Intervention: Worksheet   Patient Response to Interventions:  Engaged   Education Outcome:  Acknowledges education and In group clarification offered    Clinical Observations/Individualized Feedback: Pt appeared bright and upbeat.  Pt thought process was disorganized and responses had nothing to do with the goal of the activity.  Pt identifies priorities as headphones, dancing, eating her food in her house, going back to her normal self and "in my really friends are to me".  Pt identified telling herself she can't breath or hears sounds when wanting to give up.  Pt identified goals as dances, games, trucks/cars, cleaning and sounds.  All of the action steps for each goal were incoherent or did not go with the goal identified.     Plan: Continue to engage patient in RT group sessions 2-3x/week.   Kacelyn Rowzee-McCall, LRT,CTRS  01/14/2023 1:23 PM

## 2023-01-14 NOTE — Progress Notes (Signed)
   01/13/23 2105  Psych Admission Type (Psych Patients Only)  Admission Status Involuntary  Psychosocial Assessment  Patient Complaints Sadness;Depression;Confusion  Eye Contact Fair  Facial Expression Sad  Affect Depressed;Sad  Speech Slurred  Interaction Childlike  Motor Activity Slow  Appearance/Hygiene Unremarkable  Behavior Characteristics Cooperative  Mood Depressed;Sad  Thought Process  Coherency Disorganized  Content WDL  Delusions None reported or observed  Perception Hallucinations  Hallucination Visual  Judgment WDL  Confusion Mild  Danger to Self  Current suicidal ideation? Denies  Danger to Others  Danger to Others None reported or observed

## 2023-01-14 NOTE — Progress Notes (Addendum)
Pt restless throughout the night and required prompting to stay in her room.    01/14/23 0606  15 Minute Checks  Location Dayroom  Visual Appearance Calm  Behavior Composed  Sleep (Behavioral Health Patients Only)  Calculate sleep? (Click Yes once per 24 hr at 0600 safety check) Yes  Documented sleep last 24 hours 2

## 2023-01-14 NOTE — Group Note (Signed)
[facility] LCSW Group Therapy Note   Group Date: 01/14/2023 Start Time: 1300 End Time: 1400   Type of Therapy/Topic:  Group Therapy:  Emotion Regulation  Participation Level:  Minimal   Mood: Calm   Description of Group:    The purpose of this group is to assist patients in learning to regulate negative emotions and experience positive emotions. Patients will be guided to discuss ways in which they have been vulnerable to their negative emotions. These vulnerabilities will be juxtaposed with experiences of positive emotions or situations, and patients challenged to use positive emotions to combat negative ones. Special emphasis will be placed on coping with negative emotions in conflict situations, and patients will process healthy conflict resolution skills.  Therapeutic Goals: Patient will identify two positive emotions or experiences to reflect on in order to balance out negative emotions:  Patient will label two or more emotions that they find the most difficult to experience:  Patient will be able to demonstrate positive conflict resolution skills through discussion or role plays:   Summary of Patient Progress:  Patient came to group 10 minutes before group was over. Patient accepted the worksheet to follow along and CSW explained the topic about different emotions, signs, and behaviors. Patient was disorganized saying she was confused about her grandpa being in the [employer] or army with chains, " wait I think it may have been my uncle, but I hope they are not mad at me". Patient asked if the emotion "mad" would be underneath anger section and CSW confirmed that it was and one of the signs were "increased heart rate".     Therapeutic Modalities:   Cognitive Behavioral Therapy Feelings Identification Dialectical Behavioral Therapy   Sherre Lain, LCSWA

## 2023-01-14 NOTE — Progress Notes (Signed)
Pt very restless, talking loudly and attempting to come out her room. Pt remains disorganized with tangential speech.   Pt required frequent verbal contacts.Staff prompted pt several to remain in room. Staff provided some calming techniques and diversional activities.

## 2023-01-15 NOTE — Progress Notes (Signed)
[facility] MD Progress Note  01/15/2023 3:20 PM Rebecca Hoover  MRN:  [id number]  Reason for Admission Rebecca Hoover is a 22 y.o. female with a history of  Major depressive disorder, recurrent episode ([place]), GAD, speech impediment, SI , who was initially admitted for inpatient psychiatric hospitalization on 01/01/2023 for management of brief psychotic disorder.   Subjective: On my assessment today, patient she continues to have disorganized content of her thinking. Her speech is more linear today. She is able to speak in full sentences. She appears to have better reality testing today and is more rational at time.  She appears less sedated since my last evaluation.  She denies any auditory hallucinations.  Denies any paranoid thinking.  Denies other psychotic symptoms (her primary psychotic symptoms as disorganized and bizarre thoughts).   Reports that mood is anxious. Sleep is better per pt and also per nursing documentation, since restarting the restoril last night.  She otherwise denies any SI, HI.  Catatonia does not appear to have recurred with decreasing Ativan dose.  She reports that appetite is good.  She does report that sleep is good, and nursing documentation supports this.  C/o dry mouth now and does not report any drooling.    Principal Problem: Brief psychotic disorder ([place]) Diagnosis: Principal Problem:   Brief psychotic disorder ([place]) Active Problems:   Suicidal ideation   Speech impediment   Generalized anxiety disorder   Major depressive disorder, recurrent episode ([place])  Total Time spent with patient: 25 minutes  Past Psychiatric History:  Previous Psych Diagnoses: Major Depressive Disorder Prior psychiatric treatment: None Psychiatric medication compliance history: Compliant   Current psychiatric treatment:  Lexapro 20 mg daily for MDD, started around spring of 2023 Current psychiatrist: Last saw [facility] 6 months ago Current therapist: Denies per mother   Previous  hospitalizations Denies per mother History of suicide attempts: Denies per mother History of self harm: Denies per mother  Past Medical History: No past medical history on file. No past surgical history on file. Family History: No family history on file.  Family Psychiatric  History:  Medical: Denies per mother Psych: 66 Sister-Bipolar Disorder Psych Rx: Unknown Suicide: Denies per mother Substance use family hx: Denies per mother  Social History:  Social History   Substance and Sexual Activity  Alcohol Use None     Social History   Substance and Sexual Activity  Drug Use Not on file    Social History   Socioeconomic History   Marital status: Widowed    Spouse name: Not on file   Number of children: Not on file   Years of education: Not on file   Highest education level: Not on file  Occupational History   Not on file  Tobacco Use   Smoking status: Unknown   Smokeless tobacco: Not on file  Substance and Sexual Activity   Alcohol use: Not on file   Drug use: Not on file   Sexual activity: Not on file  Other Topics Concern   Not on file  Social History Narrative   ** Merged History Encounter **       Social Determinants of Health   Financial Resource Strain: Not on file  Food Insecurity: Not on file  Transportation Needs: Not on file  Physical Activity: Not on file  Stress: Not on file  Social Connections: Not on file   Additional Social History:  Sleep: Better   Appetite: Better  Current Medications: Current Facility-Administered Medications  Medication Dose Route Frequency Provider Last Rate Last Admin   acetaminophen (TYLENOL) tablet 650 mg  650 mg Oral Q6H PRN Evette Georges, NP   650 mg at 01/15/23 0755   alum & mag hydroxide-simeth (MAALOX/MYLANTA) 200-200-20 MG/5ML suspension 30 mL  30 mL Oral Q4H PRN Evette Georges, NP       benztropine (COGENTIN) tablet 0.5 mg  0.5 mg Oral Daily Chelan Heringer, Ovid Curd, MD   0.5  mg at 01/15/23 0998   haloperidol (HALDOL) tablet 5 mg  5 mg Oral TID PRN Janine Limbo, MD   5 mg at 01/04/23 0300   And   LORazepam (ATIVAN) tablet 2 mg  2 mg Oral TID PRN Janine Limbo, MD       And   diphenhydrAMINE (BENADRYL) capsule 50 mg  50 mg Oral TID PRN Janine Limbo, MD   50 mg at 01/04/23 0300   haloperidol lactate (HALDOL) injection 5 mg  5 mg Intramuscular TID PRN Janine Limbo, MD       And   LORazepam (ATIVAN) injection 2 mg  2 mg Intramuscular TID PRN Janine Limbo, MD       And   diphenhydrAMINE (BENADRYL) injection 50 mg  50 mg Intramuscular TID PRN Kailen Hinkle, Ovid Curd, MD       docusate sodium (COLACE) capsule 100 mg  100 mg Oral BID Clarabel Marion, Ovid Curd, MD   100 mg at 01/15/23 0756   feeding supplement (ENSURE ENLIVE / ENSURE PLUS) liquid 237 mL  237 mL Oral BID BM Nkwenti, Doris, NP   237 mL at 01/15/23 1349   hydrOXYzine (ATARAX) tablet 25 mg  25 mg Oral TID PRN Janine Limbo, MD   25 mg at 01/02/23 2355   lithium carbonate (LITHOBID) ER tablet 300 mg  300 mg Oral Q12H Coralyn Pear B, MD   300 mg at 01/15/23 0755   LORazepam (ATIVAN) tablet 1 mg  1 mg Oral Q6H PRN Ninel Abdella, Ovid Curd, MD       Or   LORazepam (ATIVAN) injection 1 mg  1 mg Intramuscular Q6H PRN Oluwatimileyin Vivier, Ovid Curd, MD       LORazepam (ATIVAN) tablet 0.5 mg  0.5 mg Oral BID Kamil Hanigan, Ovid Curd, MD   0.5 mg at 01/15/23 1329   LORazepam (ATIVAN) tablet 1 mg  1 mg Oral QHS France Ravens, MD   1 mg at 01/14/23 2036   magnesium hydroxide (MILK OF MAGNESIA) suspension 30 mL  30 mL Oral Daily PRN Evette Georges, NP   30 mL at 01/06/23 2007   norethindrone-ethinyl estradiol (LOESTRIN) 1-20 MG-MCG tablet 1 tablet  1 tablet Oral Daily Evette Georges, NP       polyethylene glycol (MIRALAX / GLYCOLAX) packet 17 g  17 g Oral Daily Khair Chasteen, Ovid Curd, MD   17 g at 01/15/23 0755   propranolol ER (INDERAL LA) 24 hr capsule 60 mg  60 mg Oral Daily Ruqaya Strauss, Ovid Curd, MD   60 mg at 01/15/23 0755    risperiDONE (RISPERDAL M-TABS) disintegrating tablet 2.5 mg  2.5 mg Oral QHS Hunter Bachar, Ovid Curd, MD   2.5 mg at 01/14/23 2037   temazepam (RESTORIL) capsule 15 mg  15 mg Oral QHS Jacci Ruberg, Ovid Curd, MD   15 mg at 01/14/23 2036   traZODone (DESYREL) tablet 50 mg  50 mg Oral QHS PRN Nena Hampe, Ovid Curd, MD        Lab Results:  No results found for this or any previous visit (from  the past 48 hour(s)).   Blood Alcohol level:  Lab Results  Component Value Date   ETH <10 25/95/6387    Metabolic Disorder Labs: Lab Results  Component Value Date   HGBA1C 4.8 01/02/2023   MPG 91.06 01/02/2023   MPG 88.19 12/29/2022   No results found for: "PROLACTIN" Lab Results  Component Value Date   CHOL 128 01/02/2023   TRIG 34 01/02/2023   HDL 54 01/02/2023   CHOLHDL 2.4 01/02/2023   VLDL 7 01/02/2023   LDLCALC 67 01/02/2023   LDLCALC 88 12/29/2022    Physical Findings: AIMS: Facial and Oral Movements Muscles of Facial Expression: None, normal Lips and Perioral Area: None, normal Jaw: None, normal Tongue: None, normal,Extremity Movements Upper (arms, wrists, hands, fingers): None, normal Lower (legs, knees, ankles, toes): None, normal, Trunk Movements Neck, shoulders, hips: None, normal, Overall Severity Severity of abnormal movements (highest score from questions above): None, normal Incapacitation due to abnormal movements: None, normal Patient's awareness of abnormal movements (rate only patient's report): No Awareness, Dental Status Current problems with teeth and/or dentures?: No Does patient usually wear dentures?: No  CIWA:    COWS:     NO eps today 01-14-23   Musculoskeletal: Strength & Muscle Tone: nml Gait & Station: nml Patient leans: N/A  Psychiatric Specialty Exam:  Presentation  General Appearance:  Bizarre; Disheveled  Eye Contact: Minimal  Speech: Slurred; Garbled  Speech Volume: Decreased  Handedness: Right   Mood and Affect   Mood: anxious  Affect: Less constricted   Thought Process  Thought Processes: Less disorganized  Descriptions of Associations: More linear  Orientation:Full (Time, Place and Person)  Thought Content: No overt delusions or paranoid thought process present  History of Schizophrenia/Schizoaffective disorder:Yes  Duration of Psychotic Symptoms: Unclear, probably about 1 month  Hallucinations:No data recorded did not report any AH or VH Ideas of Reference: Unclear, patient had previously reported paranoia and delusions  Suicidal Thoughts:No data recorded denies Homicidal Thoughts:No data recorded denies  Sensorium  Memory: Remote Fair; Immediate Fair; Recent Fair  Judgment: Poor  Insight: Poor   Executive Functions  Concentration: Poor  Attention Span: Poor   Psychomotor Activity  Psychomotor Activity:No data recorded No EPS today.   Assets  Assets: Desire for Improvement; Resilience; Leisure Time; Social Support   Sleep  Sleep:No data recorded better  Physical Exam: Physical Exam Vitals reviewed.  Constitutional:      Appearance: She is normal weight. She is not ill-appearing.  Neurological:     Mental Status: She is alert.     Motor: Weakness present.    Review of Systems  Psychiatric/Behavioral:         Psychotic   Blood pressure 101/75, pulse (!) 125, temperature 97.8 F (36.6 C), temperature source Oral, resp. rate 16, height '5\' 4"'$  (1.626 m), weight 70.3 kg, SpO2 99 %. Body mass index is 26.61 kg/m.     Treatment Plan Summary: Daily contact with patient to assess and evaluate symptoms and progress in treatment and medication management   ASSESSMENT: Brief Psychotic Disorder with catatpnic features  History of MDD  Karsyn Jamie is a 22 y.o., female with a past psychiatric history significant for Major depressive disorder, recurrent episode ([place]), GAD, speech impediment, SI who presents to the [facility]  Involuntary from behavioral health urgent care [facility]) for evaluation and management of psychosis concerning for brief psychotic disorder with AVH.   PLAN: Safety and Monitoring:             --  Involuntary admission to inpatient psychiatric unit for safety, stabilization and treatment             -- Daily contact with patient to assess and evaluate symptoms and progress in treatment             -- Patient's case to be discussed in multi-disciplinary team meeting             -- Observation Level : q15 minute checks             -- Vital signs:  q12 hours             -- Precautions: suicide, elopement, and assault   2. Medications:               Psychiatric Diagnosis and Treatment   -Continue risperidone 2.5 mg qhs- for psychosis -Continue ativan 0.5 mg / 0.5 mg/ 1 mg - for catatonia  -Continue benztropine 0.5 mg qd - for eps and drooling -Continue lithium 300 mg every 12 hours -Continue restoril 15 mg qhs for insomnia  -Previously tapered off Lexapro -previously d/c atropine drops for drooling -Continue trazodone 50 mg qhs prn  -continue bowel regimen    Agitation Protocol Benadryl 50 mg TID IV and oral as needed for agitation Haldol 5 mg TID IV and oral as needed for agitation Ativan 2 mg TID IM and oral as needed for agitation   Patient does not need nicotine replacement   Medical Diagnosis and Treatment   Abnormal Urine Analysis, resolved -UA repeat 11 days ago was normal  Tachycardia -continue propranolol ER 60 mg once daily   Concern for constiaption -continue scheduled colace 100 mg bid  -continue miralax 17g daily   Sialorrhea -  -continue cogentin 1 mg bid -continue SL atropine drops    Other as needed medications  Tylenol 650 mg every 6 hours as needed for pain Mylanta 30 mL every 4 hours as needed for indigestion Milk of magnesia 30 mL daily as needed for constipation   3. Routine and other pertinent labs: EKG monitoring: QTc: 444   Metabolism /  endocrine: BMI: Body mass index is 26.61 kg/m.    4. Group Therapy:             -- Encouraged patient to participate in unit milieu and in scheduled group therapies             -- We will address other chronic and acute stressors, which contributed to the patient's Brief psychotic disorder ([place]) in order to reduce the risk of self-harm at discharge.   5. Discharge Planning:              -- Social work and case management to assist with discharge planning and identification of hospital follow-up needs prior to discharge             -- Estimated LOS: 5-7 days             -- Discharge Concerns: Need to establish a safety plan; Medication compliance and effectiveness             -- Discharge Goals: Return home with outpatient referrals for mental health follow-up including medication management/psychotherapy   I certify that inpatient services furnished can reasonably be expected to improve the patient's condition.    Christoper Allegra, MD 01/15/2023, 3:20 PM  Total Time Spent in Direct Patient Care:  I personally spent 35 minutes on the unit in direct patient care. The direct patient care  time included face-to-face time with the patient, reviewing the patient's chart, communicating with other professionals, and coordinating care. Greater than 50% of this time was spent in counseling or coordinating care with the patient regarding goals of hospitalization, psycho-education, and discharge planning needs.   Janine Limbo, MD Psychiatrist

## 2023-01-15 NOTE — Progress Notes (Signed)
Patient ID: Rebecca Hoover, female   DOB: [birth year], 22 y.o.   MRN: [id number] Pt presents with anxious mood, affect congruent. Di presents with significant speech impediment and disorganized thoughts. [name] states '' I am not ok. My cousins haven't come to see me, I want my jeans . But I need nail clippers my skin is dry and I have a headache. ''  Pt thoughts are incongruent and conversation is circular and difficult to follow at times. She is observed to be intrusive with peers and restless, fidgety , pacing the unit at times. She comes up to Probation officer '' they treat me like a teacher would. I want to go outside now. ''  Pt redirected and educated on group schedule and programming times. She verbalized understanding. She completed her self inventory form and rates her depression and anxiety at 7/10 on scale, 10 being worst , 0 being none. She rates her hopelessness at 8/10 on scale, 0 being none 10 being worst.  Pt did not indicate any goal for today.  Pt is safe, able to make her needs known. Prn tylenol given for headache with good results.  Above discussed with MD , SW and treatment team.  Will con't to monitor.

## 2023-01-15 NOTE — Progress Notes (Signed)
Adult Psychoeducational Group Note  Date:  01/15/2023 Time:  8:25 PM  Group Topic/Focus:  Wrap-Up Group:   The focus of this group is to help patients review their daily goal of treatment and discuss progress on daily workbooks.  Participation Level:  Active  Participation Quality:  Appropriate  Affect:  Appropriate  Cognitive:  Oriented  Insight: Improving  Engagement in Group:  Distracting and Engaged  Modes of Intervention:  Discussion  Additional Comments:   Pt states that she had a good day and enjoyed the visit that she had with her mother today. Pt also states she called her sister today and spoke with her doctor. Pt endorsed anxiety and hearing voices but only at night  Gerhard Perches 01/15/2023, 8:25 PM

## 2023-01-15 NOTE — Progress Notes (Signed)
D- Patient alert and oriented.  Denies SI, HI, AVH, and pain. Patient appears disorganized and tangential. Concerns about belongings being stolen from the hospital. Complaints of heartburn and indigestion. Patient expressed grief over her father's death. Patient states that her tics are getting worse. When asked about the tics, patient states that she makes noises. Speech rapid, tangential, and slurred.  A- Scheduled medications administered to patient along with PRN Maalox/Mylanta, per MAR. Support and encouragement provided.  Routine safety checks conducted every 15 minutes.  Patient informed to notify staff with problems or concerns.  R-No adverse drug reactions noted. Patient contracts for safety at this time. Patient compliant with medications and treatment plan. Patient receptive, calm, and cooperative. Patient interacts well with others on the unit.  Patient remains safe at this time.

## 2023-01-15 NOTE — Progress Notes (Signed)
Pt was able to fall asleep after requiring prompting by staff to stay in the room during bedtime.     01/15/23 0557  15 Minute Checks  Location Bedroom  Visual Appearance Calm  Behavior Sleeping  Sleep (Behavioral Health Patients Only)  Calculate sleep? (Click Yes once per 24 hr at 0600 safety check) Yes  Documented sleep last 24 hours 6.25

## 2023-01-15 NOTE — Plan of Care (Signed)
  Problem: Education: Goal: Will be free of psychotic symptoms Outcome: Not Progressing Goal: Knowledge of the prescribed therapeutic regimen will improve Outcome: Not Progressing   Problem: Health Behavior/Discharge Planning: Goal: Compliance with prescribed medication regimen will improve Outcome: Progressing   Problem: Role Relationship: Goal: Ability to interact with others will improve Outcome: Progressing   Problem: Education: Goal: Mental status will improve Outcome: Progressing

## 2023-01-15 NOTE — Progress Notes (Signed)
   01/14/23 2035  Psych Admission Type (Psych Patients Only)  Admission Status Involuntary  Psychosocial Assessment  Patient Complaints Confusion  Eye Contact Fair  Facial Expression Fixed smile  Affect Appropriate to circumstance  Speech Unremarkable (At baseline)  Interaction Childlike;Needy  Motor Activity Slow  Appearance/Hygiene Unremarkable  Behavior Characteristics Cooperative  Mood Pleasant  Thought Process  Coherency Disorganized  Content WDL  Delusions None reported or observed  Perception WDL  Hallucination None reported or observed  Judgment WDL  Confusion Mild  Danger to Self  Current suicidal ideation? Denies  Danger to Others  Danger to Others None reported or observed

## 2023-01-15 NOTE — Progress Notes (Signed)
Adult Psychoeducational Group Note  Date:  01/15/2023 Time:  11:18 AM  Group Topic/Focus:  Goals Group:   The focus of this group is to help patients establish daily goals to achieve during treatment and discuss how the patient can incorporate goal setting into their daily lives to aide in recovery.  Participation Level:  Active  Participation Quality:  Appropriate  Affect:  Appropriate  Cognitive:  Appropriate  Insight: Appropriate  Engagement in Group:  Engaged  Modes of Intervention:  Discussion  Additional Comments:  Patient attended morning orientation/goal group and participated.  Tysen Roesler W Derika Eckles 03/09/4009, 11:18 AM

## 2023-01-15 NOTE — BHH Group Notes (Signed)
PsychoEducational Group- Patients were given a poem by Dairl Ponder '' The Owl and the [place]zee ''  Cognitive behavioral therapy techniques were used to allow patients to explore times in their life when they overcame their anxiety.  Patient left group early and did not participate.

## 2023-01-15 NOTE — Group Note (Signed)
Recreation Therapy Group Note   Group Topic:Self-Esteem  Group Date: 01/15/2023 Start Time: 1000 End Time: 4103 Facilitators: Shereka Lafortune-McCall, LRT,CTRS Location: [address] Dayroom   Goal Area(s) Addresses:  Patient will identify and write at least one positive trait about themself. Patient will acknowledge the benefit of healthy self-esteem. Patient will endorse understanding of ways to increase self-esteem.   Group Description:  LRT began group session with open dialogue asking the patients to define self-esteem and verbally identify positive qualities and traits people may possess. Patients were then instructed to design a personalized license plate, with words and drawings, representing at least 3 positive things about themselves. Pts were encouraged to include favorites, things they are proud of, what they enjoy doing, and dreams for their future. If a patient had a life motto or a meaningful phase that expressed their life values, pt's were asked to incorporate that into their design as well. Patients were given the opportunity to share their completed work with the group.   Affect/Mood: Happy   Participation Level: Engaged   Participation Quality: Independent   Behavior: Appropriate   Speech/Thought Process: Disorganized and Slurred   Insight: Lacking   Judgement: Lacking    Modes of Intervention: Art   Patient Response to Interventions:  Engaged   Education Outcome:  Acknowledges education and In group clarification offered    Clinical Observations/Individualized Feedback: Pt defined self-esteem as "when the body breaths".  Pt was engaged able to follow along with the activity.  Pt speech was slurred and still childlike.  Pt but on her plate "Rebecca Hoover" because she stated that's what people call her.  She also put plants and faces on her plate.  Pt was bright during group as well.    Plan: Continue to engage patient in RT group sessions 2-3x/week.   Rebecca Hoover, LRT,CTRS  01/15/2023 1:05 PM

## 2023-01-16 ENCOUNTER — Encounter (HOSPITAL_COMMUNITY): Payer: Self-pay

## 2023-01-16 MED ORDER — LORAZEPAM 0.5 MG PO TABS
0.2500 mg | ORAL_TABLET | Freq: Two times a day (BID) | ORAL | Status: DC
  Administered 2023-01-17 – 2023-01-18 (×3): 0.25 mg via ORAL
  Filled 2023-01-16 (×3): qty 1

## 2023-01-16 MED ORDER — RISPERIDONE 0.5 MG PO TBDP
0.5000 mg | ORAL_TABLET | Freq: Every day | ORAL | Status: DC
  Administered 2023-01-17 – 2023-01-18 (×2): 0.5 mg via ORAL
  Filled 2023-01-16 (×4): qty 1

## 2023-01-16 NOTE — Progress Notes (Signed)
   01/16/23 0541  15 Minute Checks  Location Bedroom  Visual Appearance Calm  Behavior Composed  Sleep (Behavioral Health Patients Only)  Calculate sleep? (Click Yes once per 24 hr at 0600 safety check) Yes  Documented sleep last 24 hours 7.75

## 2023-01-16 NOTE — Progress Notes (Signed)
[facility] MD Progress Note  01/16/2023 5:09 PM Rebecca Hoover  MRN:  [id number]  Reason for Admission Rebecca Hoover is a 22 y.o. female with a history of  Major depressive disorder, recurrent episode ([place]), GAD, speech impediment, SI , who was initially admitted for inpatient psychiatric hospitalization on 01/01/2023 for management of brief psychotic disorder.   Subjective: On my assessment today, patient is less disorganized in thought and has more rational and logical thoughts. Thoughts are more linear.  Her reality testing is better. Her abstraction is still impaired -when asked the similarity between a ruler and a clock, she states "the time zones you can work in spaces slowly.  But the batteries go to dad I changed that at home."  The patient was able to identify the similarity between a bike and a schoolbus today.  It was also clarified before this examination, that she understood the meaning of the word similarity. She reports sleep is better.  Nursing documents that sleep is better as well, since restarting the temazepam.  She reports less sedation and this was also observed by myself and the staff.  She denies any drooling.  Reports the dry mouth is less.  She reports last bowel movement was this morning.  She reports appetite is better. She otherwise denies of any hallucinations or paranoia.  She denies any SI or HI.  She denies any other side effects to current psychiatric medications.  Catatonia does not appear to have recurred with decreasing Ativan dose.   Called mother at 57pm - every night is a little better. She is still talking off the walls. Not at baseline yet but improved since admission. Still has nonsensical speech. We discussed hospital course since last call, we discussed medication treatment and adjustments upcoming (ie incr risperdal for tomorrow).   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  Problems:   Suicidal ideation   Speech impediment   Generalized anxiety disorder   Major depressive disorder, recurrent episode ([place])  Total Time spent with patient: 25 minutes  Past Psychiatric History:  Previous Psych Diagnoses: Major Depressive Disorder Prior psychiatric treatment: None Psychiatric medication compliance history: Compliant   Current psychiatric treatment:  Lexapro 20 mg daily for MDD, started around spring of 2023 Current psychiatrist: Last saw [facility] 6 months ago Current therapist: Denies per mother   Previous hospitalizations Denies per mother History of suicide attempts: Denies per mother History of self harm: Denies per mother  Past Medical History: No past medical history on file. No past surgical history on file. Family History: No family history on file.  Family Psychiatric  History:  Medical: Denies per mother Psych: 43 Sister-Bipolar Disorder Psych Rx: Unknown Suicide: Denies per mother Substance use family hx: Denies per mother  Social History:  Social History   Substance and Sexual Activity  Alcohol Use None     Social History   Substance and Sexual Activity  Drug Use Not on file    Social History   Socioeconomic History   Marital status: Widowed    Spouse name: Not on file   Number of children: Not on file   Years of education: Not on file   Highest education level: Not on file  Occupational History   Not on file  Tobacco Use   Smoking status: Unknown   Smokeless tobacco: Not on file  Substance and Sexual Activity   Alcohol use: Not on file   Drug use: Not on file  Sexual activity: Not on file  Other Topics Concern   Not on file  Social History Narrative   ** Merged History Encounter **       Social Determinants of Health   Financial Resource Strain: Not on file  Food Insecurity: Not on file  Transportation Needs: Not on file  Physical Activity: Not on file  Stress: Not on file  Social Connections: Not on file    Additional Social History:                         Sleep: Better   Appetite: Better  Current Medications: Current Facility-Administered Medications  Medication Dose Route Frequency Provider Last Rate Last Admin   acetaminophen (TYLENOL) tablet 650 mg  650 mg Oral Q6H PRN Evette Georges, NP   650 mg at 01/15/23 0755   alum & mag hydroxide-simeth (MAALOX/MYLANTA) 200-200-20 MG/5ML suspension 30 mL  30 mL Oral Q4H PRN Evette Georges, NP   30 mL at 01/15/23 2047   benztropine (COGENTIN) tablet 0.5 mg  0.5 mg Oral Daily Madysun Thall, Ovid Curd, MD   0.5 mg at 01/16/23 0757   haloperidol (HALDOL) tablet 5 mg  5 mg Oral TID PRN Janine Limbo, MD   5 mg at 01/04/23 0300   And   LORazepam (ATIVAN) tablet 2 mg  2 mg Oral TID PRN Janine Limbo, MD       And   diphenhydrAMINE (BENADRYL) capsule 50 mg  50 mg Oral TID PRN Janine Limbo, MD   50 mg at 01/04/23 0300   haloperidol lactate (HALDOL) injection 5 mg  5 mg Intramuscular TID PRN Thinh Cuccaro, Ovid Curd, MD       And   LORazepam (ATIVAN) injection 2 mg  2 mg Intramuscular TID PRN Eutimio Gharibian, Ovid Curd, MD       And   diphenhydrAMINE (BENADRYL) injection 50 mg  50 mg Intramuscular TID PRN Tumeka Chimenti, Ovid Curd, MD       docusate sodium (COLACE) capsule 100 mg  100 mg Oral BID Yu Cragun, Ovid Curd, MD   100 mg at 01/16/23 1608   feeding supplement (ENSURE ENLIVE / ENSURE PLUS) liquid 237 mL  237 mL Oral BID BM Nkwenti, Doris, NP   237 mL at 01/16/23 1342   hydrOXYzine (ATARAX) tablet 25 mg  25 mg Oral TID PRN Janine Limbo, MD   25 mg at 01/02/23 2355   lithium carbonate (LITHOBID) ER tablet 300 mg  300 mg Oral Q12H Coralyn Pear B, MD   300 mg at 01/16/23 0757   LORazepam (ATIVAN) tablet 1 mg  1 mg Oral Q6H PRN Marvel Sapp, Ovid Curd, MD       Or   LORazepam (ATIVAN) injection 1 mg  1 mg Intramuscular Q6H PRN Ovidio Steele, Ovid Curd, MD       Derrill Memo ON 01/17/2023] LORazepam (ATIVAN) tablet 0.25 mg  0.25 mg Oral BID Amarylis Rovito, MD        LORazepam (ATIVAN) tablet 1 mg  1 mg Oral QHS France Ravens, MD   1 mg at 01/15/23 2048   magnesium hydroxide (MILK OF MAGNESIA) suspension 30 mL  30 mL Oral Daily PRN Evette Georges, NP   30 mL at 01/06/23 2007   norethindrone-ethinyl estradiol (LOESTRIN) 1-20 MG-MCG tablet 1 tablet  1 tablet Oral Daily Evette Georges, NP       polyethylene glycol (MIRALAX / GLYCOLAX) packet 17 g  17 g Oral Daily Promise Weldin, Ovid Curd, MD   17 g at 01/16/23 0757   propranolol ER (  INDERAL LA) 24 hr capsule 60 mg  60 mg Oral Daily Farrin Shadle, MD   60 mg at 01/16/23 0756   [START ON 01/17/2023] risperiDONE (RISPERDAL M-TABS) disintegrating tablet 0.5 mg  0.5 mg Oral Daily Michaelyn Wall, MD       risperiDONE (RISPERDAL M-TABS) disintegrating tablet 2.5 mg  2.5 mg Oral QHS Alexah Kivett, MD   2.5 mg at 01/15/23 2048   temazepam (RESTORIL) capsule 15 mg  15 mg Oral QHS Yvonnia Tango, Ovid Curd, MD   15 mg at 01/15/23 2047   traZODone (DESYREL) tablet 50 mg  50 mg Oral QHS PRN Kasem Mozer, Ovid Curd, MD        Lab Results:  No results found for this or any previous visit (from the past 48 hour(s)).   Blood Alcohol level:  Lab Results  Component Value Date   ETH <10 29/92/4268    Metabolic Disorder Labs: Lab Results  Component Value Date   HGBA1C 4.8 01/02/2023   MPG 91.06 01/02/2023   MPG 88.19 12/29/2022   No results found for: "PROLACTIN" Lab Results  Component Value Date   CHOL 128 01/02/2023   TRIG 34 01/02/2023   HDL 54 01/02/2023   CHOLHDL 2.4 01/02/2023   VLDL 7 01/02/2023   LDLCALC 67 01/02/2023   LDLCALC 88 12/29/2022    Physical Findings: AIMS: Facial and Oral Movements Muscles of Facial Expression: None, normal Lips and Perioral Area: None, normal Jaw: None, normal Tongue: None, normal,Extremity Movements Upper (arms, wrists, hands, fingers): None, normal Lower (legs, knees, ankles, toes): None, normal, Trunk Movements Neck, shoulders, hips: None, normal, Overall  Severity Severity of abnormal movements (highest score from questions above): None, normal Incapacitation due to abnormal movements: None, normal Patient's awareness of abnormal movements (rate only patient's report): No Awareness, Dental Status Current problems with teeth and/or dentures?: No Does patient usually wear dentures?: No  CIWA:    COWS:      Musculoskeletal: Strength & Muscle Tone: nml Gait & Station: nml Patient leans: N/A  Psychiatric Specialty Exam:  Presentation  General Appearance:  Bizarre; Disheveled  Eye Contact: Better  Speech: Slurred; Garbled  Speech Volume: Decreased  Handedness: Right   Mood and Affect  Mood: anxious  Affect: Less constricted   Thought Process  Thought Processes: Less disorganized  Descriptions of Associations: More linear  Orientation:Full (Time, Place and Person)  Thought Content: No overt delusions or paranoid thought process present  History of Schizophrenia/Schizoaffective disorder:Yes  Duration of Psychotic Symptoms: Unclear, probably about 1 month  Hallucinations:No data recorded did not report any AH or VH Ideas of Reference: Unclear, patient had previously reported paranoia and delusions  Suicidal Thoughts:No data recorded denies Homicidal Thoughts:No data recorded denies  Sensorium  Memory: Remote Fair; Immediate Fair; Recent Fair  Judgment: Poor  Insight: Poor   Executive Functions  Concentration: Poor  Attention Span: Poor   Psychomotor Activity  Psychomotor Activity:No data recorded  Assets  Assets: Desire for Improvement; Resilience; Leisure Time; Social Support   Sleep  Sleep:No data recorded better  Physical Exam: Physical Exam Vitals reviewed.  Constitutional:      Appearance: She is normal weight. She is not ill-appearing.  Neurological:     Mental Status: She is alert.     Motor: Weakness present.    Review of Systems  Psychiatric/Behavioral:          Psychotic   Blood pressure 109/70, pulse 82, temperature 97.8 F (36.6 C), temperature source Oral, resp. rate 16, height '5\' 4"'$  (  1.626 m), weight 70.3 kg, SpO2 100 %. Body mass index is 26.61 kg/m.     Treatment Plan Summary: Daily contact with patient to assess and evaluate symptoms and progress in treatment and medication management   ASSESSMENT: Brief Psychotic Disorder with catatpnic features  History of MDD  Samika Vetsch is a 22 y.o., female with a past psychiatric history significant for Major depressive disorder, recurrent episode ([place]), GAD, speech impediment, SI who [facility] to the [facility] Involuntary from behavioral health urgent care [facility]) for evaluation and management of psychosis concerning for brief psychotic disorder with AVH.   PLAN: Safety and Monitoring:             -- Involuntary admission to inpatient psychiatric unit for safety, stabilization and treatment             -- Daily contact with patient to assess and evaluate symptoms and progress in treatment             -- Patient's case to be discussed in multi-disciplinary team meeting             -- Observation Level : q15 minute checks             -- Vital signs:  q12 hours             -- Precautions: suicide, elopement, and assault   2. Medications:               Psychiatric Diagnosis and Treatment   -Increase risperidone from 2.5 mg qhs -> to 0.5 mg qam and 2.5 mg qhs - for psychosis -Decrease ativan to 0.25 mg / 0.25 mg/ 1 mg - for catatonia  -Continue benztropine 0.5 mg qd - for eps and drooling -Continue lithium 300 mg every 12 hours -Continue restoril 15 mg qhs for insomnia  -Previously tapered off Lexapro -previously d/c atropine drops for drooling -Continue trazodone 50 mg qhs prn  -continue bowel regimen    Agitation Protocol Benadryl 50 mg TID IV and oral as needed for agitation Haldol 5 mg TID IV and oral as needed for agitation Ativan 2 mg TID IM and oral as needed for  agitation   Patient does not need nicotine replacement   Medical Diagnosis and Treatment   Abnormal Urine Analysis, resolved -UA repeat 11 days ago was normal  Tachycardia -continue propranolol ER 60 mg once daily   Concern for constiaption -continue scheduled colace 100 mg bid  -continue miralax 17g daily   Sialorrhea -  -continue cogentin 0.5 mg qd  -dc SL atropine drops    Other as needed medications  Tylenol 650 mg every 6 hours as needed for pain Mylanta 30 mL every 4 hours as needed for indigestion Milk of magnesia 30 mL daily as needed for constipation   3. Routine and other pertinent labs: EKG monitoring: QTc: 444   Metabolism / endocrine: BMI: Body mass index is 26.61 kg/m.    4. Group Therapy:             -- Encouraged patient to participate in unit milieu and in scheduled group therapies             -- We will address other chronic and acute stressors, which contributed to the patient's Brief psychotic disorder ([place]) in order to reduce the risk of self-harm at discharge.   5. Discharge Planning:              -- Social work and case management to  assist with discharge planning and identification of hospital follow-up needs prior to discharge             -- Estimated LOS: 5-7 days             -- Discharge Concerns: Need to establish a safety plan; Medication compliance and effectiveness             -- Discharge Goals: Return home with outpatient referrals for mental health follow-up including medication management/psychotherapy   I certify that inpatient services furnished can reasonably be expected to improve the patient's condition.    Christoper Allegra, MD 01/16/2023, 5:09 PM  Total Time Spent in Direct Patient Care:  I personally spent 40 minutes on the unit in direct patient care. The direct patient care time included face-to-face time with the patient, reviewing the patient's chart, communicating with other professionals, and coordinating care.  Greater than 50% of this time was spent in counseling or coordinating care with the patient regarding goals of hospitalization, psycho-education, and discharge planning needs.   Janine Limbo, MD Psychiatrist

## 2023-01-16 NOTE — Progress Notes (Signed)
Patient ID: Rebecca Hoover, female   DOB: Mar 01, 2001, 22 y.o.   MRN: [id number] Patient presents with pleasant mood, affect congruent. Rebecca Hoover states she is '' doing okay, just really sleepy today right now''  She continues to be restless and pacing at times on the unit, and at times she needs frequent redirection and reassurance, however she has been cooperative, med compliant and in no acute distress. Pt denies any SI HI or AV Hallucinations. Pt is safe, will con't to monitor.

## 2023-01-16 NOTE — Progress Notes (Signed)
The focus of this group is to help patients review their daily goal of treatment and discuss progress on daily workbooks.  Pt attended the evening group and responded to all discussion prompts from the [place]. Pt shared that today was a good day on the unit, the highlight of which hanging out with peers in the dayroom.  On the subject of staying well upon discharge, Pt responded that she planned to do all the normal things she used to do. "I want to get back to myself, going to school, getting a boyfriend. That will make me well."  Pt rated her day a 9 out of 10 and her affect was appropriate.

## 2023-01-16 NOTE — Plan of Care (Signed)
  Problem: Coping: Goal: Will verbalize feelings Outcome: Progressing   Problem: Health Behavior/Discharge Planning: Goal: Compliance with prescribed medication regimen will improve Outcome: Progressing   Problem: Role Relationship: Goal: Ability to interact with others will improve Outcome: Progressing   Problem: Safety: Goal: Ability to remain free from injury will improve Outcome: Progressing

## 2023-01-16 NOTE — BHH Group Notes (Signed)
PsychoEducational Group Note- Patients were given education about how to identify signs of mental health decompensation, and identify healthy coping skills. Pts were given toy car and given analogy of brain function as it pertains to mental health.  Patient attended the group and did participate but thought content was concrete.

## 2023-01-16 NOTE — BH IP Treatment Plan (Signed)
Interdisciplinary Treatment and Diagnostic Plan Update  01/16/2023 Time of Session: 10:20am  Rebecca Hoover MRN: [id number]  Principal Diagnosis: Brief psychotic disorder [facility])  Secondary Diagnoses: Principal Problem:   Brief psychotic disorder ([place]) Active Problems:   Suicidal ideation   Speech impediment   Generalized anxiety disorder   Major depressive disorder, recurrent episode ([place])   Current Medications:  Current Facility-Administered Medications  Medication Dose Route Frequency Provider Last Rate Last Admin   acetaminophen (TYLENOL) tablet 650 mg  650 mg Oral Q6H PRN Evette Georges, NP   650 mg at 01/15/23 0755   alum & mag hydroxide-simeth (MAALOX/MYLANTA) 200-200-20 MG/5ML suspension 30 mL  30 mL Oral Q4H PRN Evette Georges, NP   30 mL at 01/15/23 2047   benztropine (COGENTIN) tablet 0.5 mg  0.5 mg Oral Daily Massengill, Ovid Curd, MD   0.5 mg at 01/16/23 0757   haloperidol (HALDOL) tablet 5 mg  5 mg Oral TID PRN Janine Limbo, MD   5 mg at 01/04/23 0300   And   LORazepam (ATIVAN) tablet 2 mg  2 mg Oral TID PRN Janine Limbo, MD       And   diphenhydrAMINE (BENADRYL) capsule 50 mg  50 mg Oral TID PRN Janine Limbo, MD   50 mg at 01/04/23 0300   haloperidol lactate (HALDOL) injection 5 mg  5 mg Intramuscular TID PRN Massengill, Ovid Curd, MD       And   LORazepam (ATIVAN) injection 2 mg  2 mg Intramuscular TID PRN Massengill, Ovid Curd, MD       And   diphenhydrAMINE (BENADRYL) injection 50 mg  50 mg Intramuscular TID PRN Massengill, Ovid Curd, MD       docusate sodium (COLACE) capsule 100 mg  100 mg Oral BID Massengill, Ovid Curd, MD   100 mg at 01/16/23 0757   feeding supplement (ENSURE ENLIVE / ENSURE PLUS) liquid 237 mL  237 mL Oral BID BM Nkwenti, Doris, NP   237 mL at 01/15/23 1349   hydrOXYzine (ATARAX) tablet 25 mg  25 mg Oral TID PRN Janine Limbo, MD   25 mg at 01/02/23 2355   lithium carbonate (LITHOBID) ER tablet 300 mg  300 mg Oral Q12H Coralyn Pear B, MD    300 mg at 01/16/23 0757   LORazepam (ATIVAN) tablet 1 mg  1 mg Oral Q6H PRN Massengill, Ovid Curd, MD       Or   LORazepam (ATIVAN) injection 1 mg  1 mg Intramuscular Q6H PRN Massengill, Ovid Curd, MD       LORazepam (ATIVAN) tablet 0.5 mg  0.5 mg Oral BID Massengill, Ovid Curd, MD   0.5 mg at 01/16/23 0555   LORazepam (ATIVAN) tablet 1 mg  1 mg Oral QHS France Ravens, MD   1 mg at 01/15/23 2048   magnesium hydroxide (MILK OF MAGNESIA) suspension 30 mL  30 mL Oral Daily PRN Evette Georges, NP   30 mL at 01/06/23 2007   norethindrone-ethinyl estradiol (LOESTRIN) 1-20 MG-MCG tablet 1 tablet  1 tablet Oral Daily Evette Georges, NP       polyethylene glycol (MIRALAX / GLYCOLAX) packet 17 g  17 g Oral Daily Massengill, Nathan, MD   17 g at 01/16/23 0757   propranolol ER (INDERAL LA) 24 hr capsule 60 mg  60 mg Oral Daily Massengill, Ovid Curd, MD   60 mg at 01/16/23 0756   risperiDONE (RISPERDAL M-TABS) disintegrating tablet 2.5 mg  2.5 mg Oral QHS Massengill, Ovid Curd, MD   2.5 mg at 01/15/23 2048  temazepam (RESTORIL) capsule 15 mg  15 mg Oral QHS Massengill, Ovid Curd, MD   15 mg at 01/15/23 2047   traZODone (DESYREL) tablet 50 mg  50 mg Oral QHS PRN Massengill, Ovid Curd, MD       PTA Medications: Medications Prior to Admission  Medication Sig Dispense Refill Last Dose   norethindrone-ethinyl estradiol (LOESTRIN) 1-20 MG-MCG tablet Take 1 tablet by mouth daily. (Patient not taking: Reported on 12/30/2022)       Patient Stressors:    Patient Strengths:    Treatment Modalities: Medication Management, Group therapy, Case management,  1 to 1 session with clinician, Psychoeducation, Recreational therapy.   Physician Treatment Plan for Primary Diagnosis: Brief psychotic disorder ([place]) Long Term Goal(s): Improvement in symptoms so as ready for discharge   Short Term Goals: Ability to identify changes in lifestyle to reduce recurrence of condition will improve Ability to verbalize feelings will improve Ability to  disclose and discuss suicidal ideas Ability to identify and develop effective coping behaviors will improve  Medication Management: Evaluate patient's response, side effects, and tolerance of medication regimen.  Therapeutic Interventions: 1 to 1 sessions, Unit Group sessions and Medication administration.  Evaluation of Outcomes: Progressing  Physician Treatment Plan for Secondary Diagnosis: Principal Problem:   Brief psychotic disorder ([place]) Active Problems:   Suicidal ideation   Speech impediment   Generalized anxiety disorder   Major depressive disorder, recurrent episode ([place])  Long Term Goal(s): Improvement in symptoms so as ready for discharge   Short Term Goals: Ability to identify changes in lifestyle to reduce recurrence of condition will improve Ability to verbalize feelings will improve Ability to disclose and discuss suicidal ideas Ability to identify and develop effective coping behaviors will improve     Medication Management: Evaluate patient's response, side effects, and tolerance of medication regimen.  Therapeutic Interventions: 1 to 1 sessions, Unit Group sessions and Medication administration.  Evaluation of Outcomes: Progressing   RN Treatment Plan for Primary Diagnosis: Brief psychotic disorder ([place]) Long Term Goal(s): Knowledge of disease and therapeutic regimen to maintain health will improve  Short Term Goals: Ability to remain free from injury will improve, Ability to participate in decision making will improve, Ability to verbalize feelings will improve, Ability to disclose and discuss suicidal ideas, and Ability to identify and develop effective coping behaviors will improve  Medication Management: RN will administer medications as ordered by provider, will assess and evaluate patient's response and provide education to patient for prescribed medication. RN will report any adverse and/or side effects to prescribing provider.  Therapeutic  Interventions: 1 on 1 counseling sessions, Psychoeducation, Medication administration, Evaluate responses to treatment, Monitor vital signs and CBGs as ordered, Perform/monitor CIWA, COWS, AIMS and Fall Risk screenings as ordered, Perform wound care treatments as ordered.  Evaluation of Outcomes: Progressing   LCSW Treatment Plan for Primary Diagnosis: Brief psychotic disorder [facility]) Long Term Goal(s): Safe transition to appropriate next level of care at discharge, Engage patient in therapeutic group addressing interpersonal concerns.  Short Term Goals: Engage patient in aftercare planning with referrals and resources, Increase social support, Increase emotional regulation, Facilitate acceptance of mental health diagnosis and concerns, Identify triggers associated with mental health/substance abuse issues, and Increase skills for wellness and recovery  Therapeutic Interventions: Assess for all discharge needs, 1 to 1 time with Social worker, Explore available resources and support systems, Assess for adequacy in community support network, Educate family and significant other(s) on suicide prevention, Complete Psychosocial Assessment, Interpersonal group therapy.  Evaluation of  Outcomes: Progressing   Progress in Treatment: Attending groups: No. Participating in groups: No. Taking medication as prescribed: Yes. Toleration medication: No. Family/Significant other contact made: Yes, individual(s) contacted:  mother Patient understands diagnosis: Yes. Discussing patient identified problems/goals with staff: Yes. Medical problems stabilized or resolved: Yes. Denies suicidal/homicidal ideation: Yes. Issues/concerns per patient self-inventory: No.     New problem(s) identified: No, Describe:  none reported    New Short Term/Long Term Goal(s):    medication stabilization, elimination of SI thoughts, development of comprehensive mental wellness plan.      Patient Goals:  Pt will continue to  work on initial tx team goals, "I want to go back to school."  Care team will continue to assist and support her in her goals.    Discharge Plan or Barriers:  Patient will be connected to outpatient therapy and med management. No psychosocial barriers at this time.    Reason for Continuation of Hospitalization: Anxiety Delusions  Depression Mania Medication stabilization Other; describe psychosis   Estimated Length of Stay: 3-5 days   Last 3 [place] Suicide Severity Risk Score: [place] ED from 12/31/2022 in [facility] Emergency Department at [facility] ED from 12/29/2022 in [facility] ED from 01/20/2022 in [facility] Emergency Department at [place] No Risk No Risk No Risk       Last [place] 2/9 Scores:     No data to display          Scribe for Treatment Team: Darleen Crocker, Latanya Presser 01/16/2023 10:59 AM

## 2023-01-16 NOTE — Progress Notes (Signed)
D- Patient alert and oriented. Denies SI, HI, AVH, and pain. Patient visible in the dayroom and interacting with staff and peers. Patient pleasant upon assessment.  A- Scheduled medications administered to patient, per MAR. Support and encouragement provided.  Routine safety checks conducted every 15 minutes.  Patient informed to notify staff with problems or concerns.  R- No adverse drug reactions noted. Patient contracts for safety at this time. Patient compliant with medications and treatment plan. Patient receptive, calm, and cooperative. Patient interacts well with others on the unit.  Patient remains safe at this time.

## 2023-01-16 NOTE — [facility] Group Notes (Signed)
[facility] Group Notes:  (Nursing/MHT/Case Management/Adjunct)  Date:  01/16/2023  Time:  9:19 AM  Type of Therapy: Group Topic/ Focus: Goals Group: The focus of this group is to help patients establish daily goals to achieve during treatment and discuss how the patient can incorporate goal setting into their daily lives to aide in recovery.   Participation Level:  Active  Participation Quality:  Appropriate  Affect:  Appropriate  Cognitive:  Appropriate  Insight:  Appropriate  Engagement in Group:  Limited  Modes of Intervention:  Discussion  Summary of Progress/Problems:  Patient attended and participated in a goals and orientation group today. Patient's goal for day is to discharge soon.   Rebecca Hoover 01/16/2023, 9:19 AM

## 2023-01-17 NOTE — Progress Notes (Signed)
Pt became agitated with peers upon return from dinner, and was witnessed to be verbally aggressive to female peer, intrusive in female peer personal space, stating '' no smoking, no lighter, stop talking. I am not married'' per staff female had in fact not engaged with this patient and pt not following verbal redirection to disengage from yelling at peer.  Pt given prn po medication as she appeared to be responding to internal stimuli stating '' stop talking talk ing '' shaking her head. Accepted po medications without issue and verbally redirected. Pt is safe .

## 2023-01-17 NOTE — Plan of Care (Signed)
  Problem: Coping: Goal: Coping ability will improve Outcome: Progressing Goal: Will verbalize feelings Outcome: Progressing   Problem: Health Behavior/Discharge Planning: Goal: Compliance with prescribed medication regimen will improve Outcome: Progressing   Problem: Self-Care: Goal: Ability to participate in self-care as condition permits will improve Outcome: Progressing

## 2023-01-17 NOTE — Progress Notes (Signed)
[facility] MD Progress Note  01/17/2023 3:08 PM Rebecca Hoover  MRN:  [id number]  Subjective:    Rebecca Hoover is a 22 y.o. female with a history of  Major depressive disorder, recurrent episode ([place]), GAD, speech impediment, SI , who was initially admitted for inpatient psychiatric hospitalization on 01/01/2023 for management of brief psychotic disorder.   On my evaluation today, the patient's thoughts are much less disorganized and she is much more rational logical and linear.  Her speech is improved and at baseline has a speech impediment.  Reports that sleep is better and documentation from nursing supports this.  Reports that appetite is okay.  Denies any side effects of medication including drooling and dry mouth.  Reports having bowel movement daily.  Denies having any psychotic symptoms including hallucinations, paranoia, thought insertion and thought control or ideas of reference. Patient is asked about discharge and is able to discuss rationale if she would like to be discharged, and what her plans are after discharge.  I called the mother, and she thinks the patient has continued to improve.  We discussed discharge planning, and the mother is agreeable for discharge tomorrow.  We discussed discharge planning follow-up plans.  Mother will stay with patient for at least 4 days at home after discharge.  They have both medication management and psychotherapy appointments after discharge.  We again went over her diagnosis, hospital course, treatment, response to treatment, and prognosis.  At the end of the telephone conversation, the mother had no further questions.  The social worker was also present for part of the conversation, and they discussed what needed to happen from the social worker and for the patient's schooling and withdrawal from school.    Principal Problem: Brief psychotic disorder ([place]) Diagnosis: Principal Problem:   Brief psychotic disorder ([place]) Active Problems:   Suicidal  ideation   Speech impediment   Generalized anxiety disorder   Major depressive disorder, recurrent episode ([place])  Total Time spent with patient: 15 minutes  Past Psychiatric History:  Previous Psych Diagnoses: Major Depressive Disorder Prior psychiatric treatment: None Psychiatric medication compliance history: Compliant   Current psychiatric treatment:  Lexapro 20 mg daily for MDD, started around spring of 2023 Current psychiatrist: Last saw [facility] 6 months ago Current therapist: Denies per mother   Previous hospitalizations Denies per mother History of suicide attempts: Denies per mother History of self harm: Denies per mother  Past Medical History: No past medical history on file. No past surgical history on file. Family History: No family history on file.  Family Psychiatric  History: Medical: Denies per mother Psych: 43 Sister-Bipolar Disorder Psych Rx: Unknown Suicide: Denies per mother Substance use family hx: Denies per mother  Social History:  Social History   Substance and Sexual Activity  Alcohol Use None     Social History   Substance and Sexual Activity  Drug Use Not on file    Social History   Socioeconomic History   Marital status: Widowed    Spouse name: Not on file   Number of children: Not on file   Years of education: Not on file   Highest education level: Not on file  Occupational History   Not on file  Tobacco Use   Smoking status: Unknown   Smokeless tobacco: Not on file  Substance and Sexual Activity   Alcohol use: Not on file   Drug use: Not on file   Sexual activity: Not on file  Other Topics Concern   Not on  file  Social History Narrative   ** Merged History Encounter **       Social Determinants of Health   Financial Resource Strain: Not on file  Food Insecurity: Not on file  Transportation Needs: Not on file  Physical Activity: Not on file  Stress: Not on file  Social Connections: Not on file   Additional Social  History:                         Sleep: Fair  Appetite:  Fair  Current Medications: Current Facility-Administered Medications  Medication Dose Route Frequency Provider Last Rate Last Admin   acetaminophen (TYLENOL) tablet 650 mg  650 mg Oral Q6H PRN Evette Georges, NP   650 mg at 01/15/23 0755   alum & mag hydroxide-simeth (MAALOX/MYLANTA) 200-200-20 MG/5ML suspension 30 mL  30 mL Oral Q4H PRN Evette Georges, NP   30 mL at 01/15/23 2047   benztropine (COGENTIN) tablet 0.5 mg  0.5 mg Oral Daily Montez Cuda, Ovid Curd, MD   0.5 mg at 01/17/23 0736   haloperidol (HALDOL) tablet 5 mg  5 mg Oral TID PRN Janine Limbo, MD   5 mg at 01/04/23 0300   And   LORazepam (ATIVAN) tablet 2 mg  2 mg Oral TID PRN Janine Limbo, MD       And   diphenhydrAMINE (BENADRYL) capsule 50 mg  50 mg Oral TID PRN Janine Limbo, MD   50 mg at 01/04/23 0300   haloperidol lactate (HALDOL) injection 5 mg  5 mg Intramuscular TID PRN Turner Kunzman, Ovid Curd, MD       And   LORazepam (ATIVAN) injection 2 mg  2 mg Intramuscular TID PRN Pamella Samons, Ovid Curd, MD       And   diphenhydrAMINE (BENADRYL) injection 50 mg  50 mg Intramuscular TID PRN Asia Favata, Ovid Curd, MD       docusate sodium (COLACE) capsule 100 mg  100 mg Oral BID Vaniyah Lansky, Ovid Curd, MD   100 mg at 01/17/23 0736   feeding supplement (ENSURE ENLIVE / ENSURE PLUS) liquid 237 mL  237 mL Oral BID BM Nkwenti, Doris, NP   237 mL at 01/17/23 1405   hydrOXYzine (ATARAX) tablet 25 mg  25 mg Oral TID PRN Janine Limbo, MD   25 mg at 01/02/23 2355   lithium carbonate (LITHOBID) ER tablet 300 mg  300 mg Oral Q12H Coralyn Pear B, MD   300 mg at 01/17/23 0736   LORazepam (ATIVAN) tablet 1 mg  1 mg Oral Q6H PRN Titan Karner, Ovid Curd, MD       Or   LORazepam (ATIVAN) injection 1 mg  1 mg Intramuscular Q6H PRN Jaxyn Mestas, Ovid Curd, MD       LORazepam (ATIVAN) tablet 0.25 mg  0.25 mg Oral BID Ailanie Ruttan, MD   0.25 mg at 01/17/23 1404   LORazepam (ATIVAN)  tablet 1 mg  1 mg Oral QHS France Ravens, MD   1 mg at 01/16/23 2036   magnesium hydroxide (MILK OF MAGNESIA) suspension 30 mL  30 mL Oral Daily PRN Evette Georges, NP   30 mL at 01/06/23 2007   norethindrone-ethinyl estradiol (LOESTRIN) 1-20 MG-MCG tablet 1 tablet  1 tablet Oral Daily Evette Georges, NP       polyethylene glycol (MIRALAX / GLYCOLAX) packet 17 g  17 g Oral Daily Ciji Boston, MD   17 g at 01/17/23 0737   propranolol ER (INDERAL LA) 24 hr capsule 60 mg  60 mg Oral Daily Ikeem Cleckler,  Ovid Curd, MD   60 mg at 01/17/23 0736   risperiDONE (RISPERDAL M-TABS) disintegrating tablet 0.5 mg  0.5 mg Oral Daily Zeric Baranowski, MD   0.5 mg at 01/17/23 7543   risperiDONE (RISPERDAL M-TABS) disintegrating tablet 2.5 mg  2.5 mg Oral QHS Nic Lampe, MD   2.5 mg at 01/16/23 2036   temazepam (RESTORIL) capsule 15 mg  15 mg Oral QHS Denicia Pagliarulo, Ovid Curd, MD   15 mg at 01/16/23 2036   traZODone (DESYREL) tablet 50 mg  50 mg Oral QHS PRN Khye Hochstetler, Ovid Curd, MD        Lab Results: No results found for this or any previous visit (from the past 48 hour(s)).  Blood Alcohol level:  Lab Results  Component Value Date   ETH <10 60/67/7034    Metabolic Disorder Labs: Lab Results  Component Value Date   HGBA1C 4.8 01/02/2023   MPG 91.06 01/02/2023   MPG 88.19 12/29/2022   No results found for: "PROLACTIN" Lab Results  Component Value Date   CHOL 128 01/02/2023   TRIG 34 01/02/2023   HDL 54 01/02/2023   CHOLHDL 2.4 01/02/2023   VLDL 7 01/02/2023   LDLCALC 67 01/02/2023   LDLCALC 88 12/29/2022    Physical Findings: AIMS: Facial and Oral Movements Muscles of Facial Expression: None, normal Lips and Perioral Area: None, normal Jaw: None, normal Tongue: None, normal,Extremity Movements Upper (arms, wrists, hands, fingers): None, normal Lower (legs, knees, ankles, toes): None, normal, Trunk Movements Neck, shoulders, hips: None, normal, Overall Severity Severity of abnormal movements  (highest score from questions above): None, normal Incapacitation due to abnormal movements: None, normal Patient's awareness of abnormal movements (rate only patient's report): No Awareness, Dental Status Current problems with teeth and/or dentures?: No Does patient usually wear dentures?: No  CIWA:    COWS:     Musculoskeletal: Strength & Muscle Tone: within normal limits Gait & Station: normal Patient leans: N/A  Psychiatric Specialty Exam:  Presentation  General Appearance:  Casual  Eye Contact: Good  Speech: Slurred  Speech Volume: Normal  Handedness: Right   Mood and Affect  Mood: Euthymic; Anxious  Affect: Congruent; Full Range   Thought Process  Thought Processes: Linear  Descriptions of Associations:Intact  Orientation:Full (Time, Place and Person)  Thought Content:Logical  History of Schizophrenia/Schizoaffective disorder:No  Duration of Psychotic Symptoms:Less than six months  Hallucinations:Hallucinations: None  Ideas of Reference:None  Suicidal Thoughts:Suicidal Thoughts: No  Homicidal Thoughts:Homicidal Thoughts: No   Sensorium  Memory: Immediate Good; Recent Good; Remote Good  Judgment: Fair  Insight: Lacking   Executive Functions  Concentration: Fair  Attention Span: Fair  Recall: [place] of Knowledge: Fair  Language: Fair   Psychomotor Activity  Psychomotor Activity: Psychomotor Activity: Normal   Assets  Assets: Desire for Improvement; Resilience; Leisure Time; Social Support   Sleep  Sleep: Sleep: Fair    Physical Exam: Physical Exam Vitals reviewed.  Constitutional:      General: She is not in acute distress.    Appearance: She is normal weight. She is not toxic-appearing.  Neurological:     Mental Status: She is alert.     Motor: No weakness.     Gait: Gait normal.    Review of Systems  Psychiatric/Behavioral:  Negative for depression, hallucinations, substance abuse and  suicidal ideas. The patient is nervous/anxious. The patient does not have insomnia.    Blood pressure (!) 122/95, pulse (!) 107, temperature 97.7 F (36.5 C), temperature source Oral, resp. rate 16,  height '5\' 4"'$  (1.626 m), weight 70.3 kg, SpO2 100 %. Body mass index is 26.61 kg/m.   Treatment Plan Summary: Daily contact with patient to assess and evaluate symptoms and progress in treatment and Medication management   ASSESSMENT: Brief Psychotic Disorder with catatonic features  History of MDD   Ahmani Daoud is a 22 y.o., female with a past psychiatric history significant for Major depressive disorder, recurrent episode ([place]), GAD, speech impediment, SI who presents to the [facility] Involuntary from behavioral health urgent care [facility]) for evaluation and management of psychosis concerning for brief psychotic disorder with AVH.   PLAN: Safety and Monitoring:             -- Involuntary admission to inpatient psychiatric unit for safety, stabilization and treatment             -- Daily contact with patient to assess and evaluate symptoms and progress in treatment             -- Patient's case to be discussed in multi-disciplinary team meeting             -- Observation Level : q15 minute checks             -- Vital signs:  q12 hours             -- Precautions: suicide, elopement, and assault   2. Medications:               Psychiatric Diagnosis and Treatment   -Continue risperidone 0.5 mg qam and 2.5 mg qhs - for psychosis -Continue ativan 0.25 mg / 0.25 mg/ 1 mg - for catatonia  -Continue benztropine 0.5 mg qd - for eps and drooling -Continue lithium 300 mg every 12 hours -Continue restoril 15 mg qhs for insomnia  -Previously tapered off Lexapro -previously d/c atropine drops for drooling -Continue trazodone 50 mg qhs prn  -continue bowel regimen    Agitation Protocol Benadryl 50 mg TID IV and oral as needed for agitation Haldol 5 mg TID IV and oral as needed  for agitation Ativan 2 mg TID IM and oral as needed for agitation   Patient does not need nicotine replacement   Medical Diagnosis and Treatment   Abnormal Urine Analysis, resolved -UA repeat 11 days ago was normal   Tachycardia -continue propranolol ER 60 mg once daily    Concern for constiaption -continue scheduled colace 100 mg bid  -continue miralax 17g daily    Sialorrhea -  -continue cogentin 0.5 mg qd  -dc SL atropine drops    Other as needed medications  Tylenol 650 mg every 6 hours as needed for pain Mylanta 30 mL every 4 hours as needed for indigestion Milk of magnesia 30 mL daily as needed for constipation   3. Routine and other pertinent labs: EKG monitoring: QTc: 444   Metabolism / endocrine: BMI: Body mass index is 26.61 kg/m.    4. Group Therapy:             -- Encouraged patient to participate in unit milieu and in scheduled group therapies             -- We will address other chronic and acute stressors, which contributed to the patient's Brief psychotic disorder ([place]) in order to reduce the risk of self-harm at discharge.   5. Discharge Planning:              -- Social work and case management to assist with  discharge planning and identification of hospital follow-up needs prior to discharge             -- Estimated LOS: Plan to discharge tomorrow             -- Discharge Concerns: Need to establish a safety plan; Medication compliance and effectiveness             -- Discharge Goals: Return home with outpatient referrals for mental health follow-up including medication management/psychotherapy      Christoper Allegra, MD 01/17/2023, 3:08 PM  Total Time Spent in Direct Patient Care:  I personally spent 35 minutes on the unit in direct patient care. The direct patient care time included face-to-face time with the patient, reviewing the patient's chart, communicating with other professionals, and coordinating care. Greater than 50% of this time was  spent in counseling or coordinating care with the patient regarding goals of hospitalization, psycho-education, and discharge planning needs.   Janine Limbo, MD Psychiatrist

## 2023-01-17 NOTE — Progress Notes (Signed)
   01/17/23 0547  15 Minute Checks  Location Bedroom  Visual Appearance Calm  Behavior Composed  Sleep (Behavioral Health Patients Only)  Calculate sleep? (Click Yes once per 24 hr at 0600 safety check) Yes

## 2023-01-17 NOTE — Progress Notes (Signed)
Adult Psychoeducational Group Note  Date:  01/17/2023 Time:  9:48 AM  Group Topic/Focus:  Goals Group:   The focus of this group is to help patients establish daily goals to achieve during treatment and discuss how the patient can incorporate goal setting into their daily lives to aide in recovery.  Participation Level:  Active  Participation Quality:  Appropriate  Affect:  Appropriate  Cognitive:  Appropriate  Insight: Appropriate  Engagement in Group:  Engaged  Modes of Intervention:  Discussion  Additional Comments:  Patient attended morning orientation/goal setting group and participated.   Giovani Neumeister W Ranferi Clingan 01/24/3150, 9:48 AM

## 2023-01-17 NOTE — Progress Notes (Signed)
patient presents with pleasant mood, affect labile at times. Rebecca Hoover states she is ' confused, just ready to go home. I miss my home I want to be in my bed'' Patient is observed to be irritable and agitated at times, cursing at another peer stating '' you just need to shut up!'' She has been easily verbally redirected.   She continues to be restless and pacing at times on the unit, and at times she needs frequent redirection and reassurance, however she has been cooperative, med compliant and able to make her needs known. Pt thought content has improved.  Pt denies any SI HI or AV Hallucinations. Eating and drinking well.  Pt is safe, will con't to monitor

## 2023-01-17 NOTE — Group Note (Signed)
Recreation Therapy Group Note   Group Topic:Health and Wellness  Group Date: 01/17/2023 Start Time: 1004 End Time: 1040 Facilitators: Zi Sek-McCall, LRT,CTRS Location: [address] Dayroom   Goal Area(s) Addresses:  Patient will define components of whole wellness. Patient will verbalize benefit of whole wellness.  Group Description:  Mental Gymnastics.  LRT and patients discussed the components of wellness (mental, physical and spiritual).  LRT and patients also discussed the importance of wellness and how it affects [place] on a daily basis.  LRT then gave patients two worksheets of brain teasers.  LRT explained to patients instead of doing physical exercise, they were going to exercise their brains and solve the brain teasers presented on the worksheets.  Patients were given 20 minutes to decode as many of brain teasers they could before they went over them as a group.    Affect/Mood: Appropriate   Participation Level: Engaged   Participation Quality: Independent   Behavior: Appropriate and Interactive    Speech/Thought Process: Incoherent and Pressured   Insight: Lacking   Judgement: Lacking    Modes of Intervention: Worksheet   Patient Response to Interventions:  Engaged   Education Outcome:  Acknowledges education and In group clarification offered    Clinical Observations/Individualized Feedback: Pt was bright and engaged in group.  Pt speech was still childlike and mumbled together.  Pt speech also seemed pressured.  Pt struggled to solve the teasers and would just repeat what her peers said.  Pt was social with peers and appropriate in behavior.      Plan: Continue to engage patient in RT group sessions 2-3x/week.   Evania Lyne-McCall, LRT,CTRS 01/17/2023 11:27 AM

## 2023-01-17 NOTE — Progress Notes (Signed)
D- Patient alert and oriented. Denies SI, HI,  and pain. Endorses AVH. States voices are saying, "Really bad meds," and telling patient that she is sick. Patient reports seeing cars on the unit. Patient calm and pleasant, but disorganized at times.   A- Scheduled medications administered to patient, per MAR. Support and encouragement provided.  Routine safety checks conducted every 15 minutes.  Patient informed to notify staff with problems or concerns.  R- No adverse drug reactions noted. Patient contracts for safety at this time. Patient compliant with medications and treatment plan. Patient receptive, calm, and cooperative. Patient interacts well with others on the unit.  Patient remains safe at this time.

## 2023-01-18 MED ORDER — RISPERIDONE 2 MG PO TABS
2.5000 mg | ORAL_TABLET | Freq: Every day | ORAL | Status: DC
  Filled 2023-01-18: qty 1

## 2023-01-18 MED ORDER — RISPERIDONE 0.5 MG PO TABS: 0.5000 mg | ORAL_TABLET | Freq: Every morning | ORAL | 0 refills | Status: DC

## 2023-01-18 MED ORDER — TEMAZEPAM 15 MG PO CAPS: 15.0000 mg | ORAL_CAPSULE | Freq: Every evening | ORAL | 0 refills | Status: DC | PRN

## 2023-01-18 MED ORDER — HYDROXYZINE HCL 25 MG PO TABS: 25.0000 mg | ORAL_TABLET | Freq: Three times a day (TID) | ORAL | 0 refills | Status: DC | PRN

## 2023-01-18 MED ORDER — RISPERIDONE 1 MG PO TABS: 1.0000 mg | ORAL_TABLET | Freq: Every day | ORAL | Status: DC

## 2023-01-18 MED ORDER — LORAZEPAM 1 MG PO TABS: 1.0000 mg | ORAL_TABLET | Freq: Every day | ORAL | 0 refills | Status: AC

## 2023-01-18 MED ORDER — LORAZEPAM 0.5 MG PO TABS: 0.2500 mg | ORAL_TABLET | Freq: Two times a day (BID) | ORAL | 0 refills | Status: AC

## 2023-01-18 MED ORDER — BENZTROPINE MESYLATE 0.5 MG PO TABS: 0.5000 mg | ORAL_TABLET | Freq: Every day | ORAL | 0 refills | Status: DC

## 2023-01-18 MED ORDER — RISPERIDONE 0.5 MG PO TABS
0.5000 mg | ORAL_TABLET | Freq: Every morning | ORAL | Status: DC
  Filled 2023-01-18 (×2): qty 1

## 2023-01-18 MED ORDER — TRAZODONE HCL 50 MG PO TABS: 50.0000 mg | ORAL_TABLET | Freq: Every evening | ORAL | 0 refills | Status: DC | PRN

## 2023-01-18 MED ORDER — RISPERIDONE 1 MG PO TABS: 2.5000 mg | ORAL_TABLET | Freq: Every day | ORAL | 0 refills | Status: DC

## 2023-01-18 MED ORDER — LITHIUM CARBONATE ER 300 MG PO TBCR: 300.0000 mg | EXTENDED_RELEASE_TABLET | Freq: Two times a day (BID) | ORAL | 0 refills | Status: AC

## 2023-01-18 NOTE — Progress Notes (Signed)
   01/18/23 6999  15 Minute Checks  Location Bedroom  Visual Appearance Calm  Behavior Composed  Sleep (Behavioral Health Patients Only)  Calculate sleep? (Click Yes once per 24 hr at 0600 safety check) Yes  Documented sleep last 24 hours 7

## 2023-01-18 NOTE — [facility] Suicide Risk Assessment (Addendum)
[facility] Discharge Suicide Risk Assessment   Principal Problem: Brief psychotic disorder [facility]) Discharge Diagnoses: Principal Problem:   Brief psychotic disorder ([place]) Active Problems:   Suicidal ideation   Speech impediment   Generalized anxiety disorder   Major depressive disorder, recurrent episode ([place])   Total Time spent with patient: 15 minutes  Rebecca Hoover is a 22 y.o. female with a history of  Major depressive disorder, recurrent episode ([place]), GAD, speech impediment, SI , who was initially admitted for inpatient psychiatric hospitalization on 01/01/2023 for management of brief psychotic disorder.      During the patient's hospitalization, patient had extensive initial psychiatric evaluation, and follow-up psychiatric evaluations every day.   Psychiatric diagnoses provided during admission:  -Brief psychotic disorder, with catatonic features   Patient's psychiatric medications were adjusted on admission:  -Taper off lexapro - Start risperdal 1 mg q12H for psychosis - Start ativan 0.5 mg q12H for psychosis, agitation, anxiety - Ordered first psychotic episode labs   During the hospitalization, other adjustments were made to the patient's psychiatric medication regimen:  -risperdal was ultimately titrated to 0.5 mg in the morning and 2.5 mg in the evening  -ativan was titrated to 0.25 mg at 800, 0.25 mg at 1400, and 1 mg at bedtime - for catatonia. THE PATIENT DID NOT TOLERATE BEING TAPERED OFF OF ATIVAN AND BECAME VERY CATATONIC.    Patient's care was discussed during the interdisciplinary team meeting every day during the hospitalization.   The patient denied having side effects to prescribed psychiatric medication.   Gradually, patient started adjusting to milieu. The patient was evaluated each day by a clinical provider to ascertain response to treatment. Improvement was noted by the patient's report of decreasing symptoms, improved sleep and appetite, affect, medication  tolerance, behavior, and participation in unit programming.  Patient was asked each day to complete a self inventory noting mood, mental status, pain, new symptoms, anxiety and concerns.     Symptoms were reported as significantly decreased or resolved completely by discharge.    On day of discharge, the patient reports that their mood is stable. The patient denied having suicidal thoughts for more than 48 hours prior to discharge.  Patient denies having homicidal thoughts.  Patient denies having auditory hallucinations.  Patient denies any visual hallucinations or other symptoms of psychosis. The patient was motivated to continue taking medication with a goal of continued improvement in mental health.    The patient reports their target psychiatric symptoms of PSYCHOSIS responded well to the psychiatric medications, and the patient reports overall benefit other psychiatric hospitalization. Supportive psychotherapy was provided to the patient. The patient also participated in regular group therapy while hospitalized. Coping skills, problem solving as well as relaxation therapies were also part of the unit programming.   Labs were reviewed with the patient, and abnormal results were discussed with the patient.   The patient is able to verbalize their individual safety plan to this provider.   # It is recommended to the patient to continue psychiatric medications as prescribed, after discharge from the hospital.     # It is recommended to the patient to follow up with your outpatient psychiatric provider and PCP.   # It was discussed with the patient, the impact of alcohol, drugs, tobacco have been there overall psychiatric and medical wellbeing, and total abstinence from substance use was recommended the patient.ed.   # Prescriptions provided or sent directly to preferred pharmacy at discharge. Patient agreeable to plan. Given opportunity to  ask questions. Appears to feel comfortable with discharge.     # In the event of worsening symptoms, the patient is instructed to call the crisis hotline, 911 and or go to the nearest ED for appropriate evaluation and treatment of symptoms. To follow-up with primary care provider for other medical issues, concerns and or health care needs   # Patient was discharged home with mother, with a plan to follow up as noted below.     Psychiatric Specialty Exam  Presentation  General Appearance:  Appropriate for Environment; Casual; Fairly Groomed  Eye Contact: Good  Speech: Normal Rate (speech impediement at baseline)  Speech Volume: Normal  Handedness: Right   Mood and Affect  Mood: Euthymic; Anxious  Duration of Depression Symptoms: Greater than two weeks  Affect: Appropriate; Full Range   Thought Process  Thought Processes: Linear  Descriptions of Associations:Intact  Orientation:Full (Time, Place and Person)  Thought Content:Logical  History of Schizophrenia/Schizoaffective disorder:No  Duration of Psychotic Symptoms:Less than six months  Hallucinations:Hallucinations: None  Ideas of Reference:None  Suicidal Thoughts:Suicidal Thoughts: No  Homicidal Thoughts:Homicidal Thoughts: No   Sensorium  Memory: Immediate Good; Recent Good; Remote Good  Judgment: Fair  Insight: Shallow   Executive Functions  Concentration: Fair  Attention Span: Fair  Recall: [place] of Knowledge: Fair  Language: Fair   Psychomotor Activity  Psychomotor Activity: Psychomotor Activity: Normal   Assets  Assets: Desire for Improvement; Resilience; Leisure Time; Social Support   Sleep  Sleep: Sleep: Fair   Physical Exam: Physical Exam See discharge summary  ROS See discharge summary  Blood pressure 108/63, pulse (!) 127, temperature 98.1 F (36.7 C), temperature source Oral, resp. rate 16, height '5\' 4"'$  (1.626 m), weight 70.3 kg, SpO2 100 %. Body mass index is 26.61 kg/m.  Mental Status Per Nursing  Assessment::   On Admission:    psychotic   Demographic Factors:  Adolescent or young adult and Caucasian  Loss Factors: NA  Historical Factors: NA  Risk Reduction Factors:   Living with another person, especially a relative, Positive social support, Positive therapeutic relationship, and Positive coping skills or problem solving skills  Continued Clinical Symptoms:  Psychosis - psychosis significantly less with treatment. Denying current psychotic symptoms. Denying SI, HI.   Cognitive Features That Contribute To Risk:  None    Suicide Risk:  Mild:  There are no identifiable suicide plans, no associated intent, mild dysphoria and related symptoms, good self-control (both objective and subjective assessment), few other risk factors, and identifiable protective factors, including available and accessible social support.    [facility] Follow up on 01/21/2023.   Why: You have an appointment for therapy services on 01/21/23 at 5:00 pm.   This will be a Virtual appointment. Contact information: [place], [place], [place] [address] [phone]         [place]. Go on 01/23/2023.   Why: You have an appointment for medication management services on  01/23/23 at 1:00 pm.  The appointment will be held in person. Contact information: [place] [address] [phone]                 Plan Of Care/Follow-up recommendations:    Activity: as tolerated   Diet: heart healthy   Other: -Follow-up with your outpatient psychiatric provider -instructions on appointment date, time, and address (location) are provided to you in discharge paperwork.   -  Take your psychiatric medications as prescribed at discharge - instructions are provided to you in the discharge paperwork   -Follow-up with outpatient primary care doctor and other specialists -for management of  preventative medicine and chronic medical disease.   -Testing: Follow-up with outpatient provider for lab results:  01-08-2023:  TSH 1.624 BMP: 139/3.6/105/22/11/0.70/97 Lithium level: 0.40   -Recommend abstinence from alcohol, tobacco, and other illicit drug use at discharge.    -If your psychiatric symptoms recur, worsen, or if you have side effects to your psychiatric medications, call your outpatient psychiatric provider, 911, 988 or go to the nearest emergency department.   -If suicidal thoughts recur, call your outpatient psychiatric provider, 911, 988 or go to the nearest emergency department.     Christoper Allegra, MD 01/18/2023, 10:41 AM

## 2023-01-18 NOTE — Discharge Summary (Addendum)
Physician Discharge Summary Note  Patient:  Rebecca Hoover is an 22 y.o., female MRN:  [id number] DOB:  2001/10/07 Patient phone:  [phone] (home)  Patient address:   [place] [address],  Total Time spent with patient: 15 minutes  Date of Admission:  01/01/2023 Date of Discharge: 01-18-2023  Reason for Admission:    Rebecca Hoover is a 22 y.o. female with a history of  Major depressive disorder, recurrent episode ([place]), GAD, speech impediment, SI , who was initially admitted for inpatient psychiatric hospitalization on 01/01/2023 for management of brief psychotic disorder.      Principal Problem: Brief psychotic disorder [facility]) Discharge Diagnoses: Principal Problem:   Brief psychotic disorder ([place]) Active Problems:   Suicidal ideation   Speech impediment   Generalized anxiety disorder   Major depressive disorder, recurrent episode ([place])   Past Psychiatric History:  Previous Psych Diagnoses: Major Depressive Disorder Prior psychiatric treatment: None Psychiatric medication compliance history: Compliant   Current psychiatric treatment:  Lexapro 20 mg daily for MDD, started around spring of 2023 Current psychiatrist: Last saw [facility] 6 months ago Current therapist: Denies per mother   Previous hospitalizations Denies per mother History of suicide attempts: Denies per mother History of self harm: Denies per mother  Past Medical History: No past medical history on file. No past surgical history on file. Family History: No family history on file.  Family Psychiatric  History:  Medical: Denies per mother Psych: 74 Sister-Bipolar Disorder Psych Rx: Unknown Suicide: Denies per mother Substance use family hx: Denies per mother    Social History:  Social History   Substance and Sexual Activity  Alcohol Use None     Social History   Substance and Sexual Activity  Drug Use Not on file    Social History   Socioeconomic History    Marital status: Widowed    Spouse name: Not on file   Number of children: Not on file   Years of education: Not on file   Highest education level: Not on file  Occupational History   Not on file  Tobacco Use   Smoking status: Unknown   Smokeless tobacco: Not on file  Substance and Sexual Activity   Alcohol use: Not on file   Drug use: Not on file   Sexual activity: Not on file  Other Topics Concern   Not on file  Social History Narrative   ** Merged History Encounter **       Social Determinants of Health   Financial Resource Strain: Not on file  Food Insecurity: Not on file  Transportation Needs: Not on file  Physical Activity: Not on file  Stress: Not on file  Social Connections: Not on file    Hospital Course:    During the patient's hospitalization, patient had extensive initial psychiatric evaluation, and follow-up psychiatric evaluations every day.  Psychiatric diagnoses provided during admission:  -Brief psychotic disorder, with catatonic features  Patient's psychiatric medications were adjusted on admission:  -Taper off lexapro - Start risperdal 1 mg q12H for AVH  - Start ativan 0.5 mg q12H for psychosis, agitation, anxiety - Ordered first psychotic episode labs  During the hospitalization, other adjustments were made to the patient's psychiatric medication regimen:  -risperdal was ultimately titrated to 0.5 mg in the morning and 2.5 mg in the evening  -ativan was titrated to 0.25 mg at 800, 0.25 mg at 1400, and 1 mg at bedtime - for catatonia. THE PATIENT DID NOT TOLERATE BEING TAPERED  OFF OF ATIVAN AND BECAME VERY CATATONIC.   Patient's care was discussed during the interdisciplinary team meeting every day during the hospitalization.  The patient denied having side effects to prescribed psychiatric medication.  Gradually, patient started adjusting to milieu. The patient was evaluated each day by a clinical provider to ascertain response to treatment.  Improvement was noted by the patient's report of decreasing symptoms, improved sleep and appetite, affect, medication tolerance, behavior, and participation in unit programming.  Patient was asked each day to complete a self inventory noting mood, mental status, pain, new symptoms, anxiety and concerns.    Symptoms were reported as significantly decreased or resolved completely by discharge.   On day of discharge, the patient reports that their mood is stable. The patient denied having suicidal thoughts for more than 48 hours prior to discharge.  Patient denies having homicidal thoughts.  Patient denies having auditory hallucinations.  Patient denies any visual hallucinations or other symptoms of psychosis. The patient was motivated to continue taking medication with a goal of continued improvement in mental health.   The patient reports their target psychiatric symptoms of PSYCHOSIS responded well to the psychiatric medications, and the patient reports overall benefit other psychiatric hospitalization. Supportive psychotherapy was provided to the patient. The patient also participated in regular group therapy while hospitalized. Coping skills, problem solving as well as relaxation therapies were also part of the unit programming.  Labs were reviewed with the patient, and abnormal results were discussed with the patient.  The patient is able to verbalize their individual safety plan to this provider.  # It is recommended to the patient to continue psychiatric medications as prescribed, after discharge from the hospital.    # It is recommended to the patient to follow up with your outpatient psychiatric provider and PCP.  # It was discussed with the patient, the impact of alcohol, drugs, tobacco have been there overall psychiatric and medical wellbeing, and total abstinence from substance use was recommended the patient.ed.  # Prescriptions provided or sent directly to preferred pharmacy at  discharge. Patient agreeable to plan. Given opportunity to ask questions. Appears to feel comfortable with discharge.    # In the event of worsening symptoms, the patient is instructed to call the crisis hotline, 911 and or go to the nearest ED for appropriate evaluation and treatment of symptoms. To follow-up with primary care provider for other medical issues, concerns and or health care needs  # Patient was discharged home with mother, with a plan to follow up as noted below.   Physical Findings: AIMS: Facial and Oral Movements Muscles of Facial Expression: None, normal Lips and Perioral Area: None, normal Jaw: None, normal Tongue: None, normal,Extremity Movements Upper (arms, wrists, hands, fingers): None, normal Lower (legs, knees, ankles, toes): None, normal, Trunk Movements Neck, shoulders, hips: None, normal, Overall Severity Severity of abnormal movements (highest score from questions above): None, normal Incapacitation due to abnormal movements: None, normal Patient's awareness of abnormal movements (rate only patient's report): No Awareness, Dental Status Current problems with teeth and/or dentures?: No Does patient usually wear dentures?: No  CIWA:    COWS:     Aims score zero on my exam. No eps on my exam.   Musculoskeletal: Strength & Muscle Tone: within normal limits Gait & Station: normal Patient leans: N/A   Psychiatric Specialty Exam:  Presentation  General Appearance:  Appropriate for Environment; Casual; Fairly Groomed  Eye Contact: Good  Speech: Normal Rate (speech impediement at baseline)  Speech Volume: Normal  Handedness: Right   Mood and Affect  Mood: Euthymic; Anxious  Affect: Appropriate; Full Range   Thought Process  Thought Processes: Linear  Descriptions of Associations:Intact  Orientation:Full (Time, Place and Person)  Thought Content:Logical  History of Schizophrenia/Schizoaffective disorder:No  Duration of  Psychotic Symptoms:Less than six months  Hallucinations:Hallucinations: None  Ideas of Reference:None  Suicidal Thoughts:Suicidal Thoughts: No  Homicidal Thoughts:Homicidal Thoughts: No   Sensorium  Memory: Immediate Good; Recent Good; Remote Good  Judgment: Fair  Insight: Shallow   Executive Functions  Concentration: Fair  Attention Span: Fair  Recall: [place] of Knowledge: Fair  Language: Fair   Psychomotor Activity  Psychomotor Activity: Psychomotor Activity: Normal   Assets  Assets: Desire for Improvement; Resilience; Leisure Time; Social Support   Sleep  Sleep: Sleep: Fair    Physical Exam: Physical Exam Vitals reviewed.  Constitutional:      General: She is not in acute distress.    Appearance: She is normal weight. She is not toxic-appearing.  Neurological:     Mental Status: She is alert.     Motor: No weakness.     Gait: Gait normal.  Psychiatric:        Mood and Affect: Mood normal.        Behavior: Behavior normal.        Judgment: Judgment normal.    Review of Systems  Constitutional:  Negative for chills and fever.  Cardiovascular:  Negative for chest pain and palpitations.  Neurological:  Negative for dizziness, tingling, tremors and headaches.  Psychiatric/Behavioral:  Negative for depression, hallucinations, memory loss, substance abuse and suicidal ideas. The patient is not nervous/anxious and does not have insomnia.   All other systems reviewed and are negative.  Blood pressure 108/63, pulse (!) 127, temperature 98.1 F (36.7 C), temperature source Oral, resp. rate 16, height '5\' 4"'$  (1.626 m), weight 70.3 kg, SpO2 100 %. Body mass index is 26.61 kg/m.   Social History   Tobacco Use  Smoking Status Unknown  Smokeless Tobacco Not on file   Tobacco Cessation:  N/A, patient does not currently use tobacco products   Blood Alcohol level:  Lab Results  Component Value Date   ETH <10 46/27/0350    Metabolic  Disorder Labs:  Lab Results  Component Value Date   HGBA1C 4.8 01/02/2023   MPG 91.06 01/02/2023   MPG 88.19 12/29/2022   No results found for: "PROLACTIN" Lab Results  Component Value Date   CHOL 128 01/02/2023   TRIG 34 01/02/2023   HDL 54 01/02/2023   CHOLHDL 2.4 01/02/2023   VLDL 7 01/02/2023   LDLCALC 67 01/02/2023   LDLCALC 88 12/29/2022    See Psychiatric Specialty Exam and Suicide Risk Assessment completed by Attending Physician prior to discharge.  Discharge destination:  Home  Is patient on multiple antipsychotic therapies at discharge:  No   Has Patient had three or more failed trials of antipsychotic monotherapy by history:  No  Recommended Plan for Multiple Antipsychotic Therapies: NA  Discharge Instructions     Diet - low sodium heart healthy   Complete by: As directed    Increase activity slowly   Complete by: As directed       Allergies as of 01/18/2023   No Known Allergies      Medication List     TAKE these medications      Indication  benztropine 0.5 MG tablet Commonly known as: COGENTIN Take 1 tablet (0.5 mg  total) by mouth daily. Start taking on: January 19, 2023  Indication: Extrapyramidal Reaction caused by Medications   hydrOXYzine 25 MG tablet Commonly known as: ATARAX Take 1 tablet (25 mg total) by mouth 3 (three) times daily as needed for anxiety.  Indication: Feeling Anxious   lithium carbonate 300 MG ER tablet Commonly known as: LITHOBID Take 1 tablet (300 mg total) by mouth every 12 (twelve) hours.  Indication: Manic-Depression   LORazepam 1 MG tablet Commonly known as: ATIVAN Take 1 tablet (1 mg total) by mouth at bedtime.  Indication: Feeling Anxious, Catatonia   LORazepam 0.5 MG tablet Commonly known as: ATIVAN Take 0.5 tablets (0.25 mg total) by mouth 2 (two) times daily. Take 1/2 tablet at 800. Take 1/2 tablet at 1400.  Indication: Feeling Anxious, Catatonia   norethindrone-ethinyl estradiol 1-20 MG-MCG  tablet Commonly known as: LOESTRIN Take 1 tablet by mouth daily.  Indication: Birth Control Treatment   risperiDONE 0.5 MG tablet Commonly known as: RISPERDAL Take 1 tablet (0.5 mg total) by mouth in the morning. Start taking on: January 19, 2023  Indication: Schizophrenia   risperiDONE 1 MG tablet Commonly known as: RISPERDAL Take 2.5 tablets (2.5 mg total) by mouth at bedtime. Start taking on: January 19, 2023  Indication: Schizophrenia   temazepam 15 MG capsule Commonly known as: RESTORIL Take 1 capsule (15 mg total) by mouth at bedtime as needed for sleep.  Indication: Trouble Sleeping   traZODone 50 MG tablet Commonly known as: DESYREL Take 1 tablet (50 mg total) by mouth at bedtime as needed for sleep.  Indication: [facility] Follow up on 01/21/2023.   Why: You have an appointment for therapy services on 01/21/23 at 5:00 pm.   This will be a Virtual appointment. Contact information: [place], [place], [place] [address] [phone]         [facility]. Go on 01/23/2023.   Why: You have an appointment for medication management services on  01/23/23 at 1:00 pm.  The appointment will be held in person. Contact information: [place] [address] [phone]                 Follow-up recommendations:    Activity: as tolerated  Diet: heart healthy  Other: -Follow-up with your outpatient psychiatric provider -instructions on appointment date, time, and address (location) are provided to you in discharge paperwork.  -Take your psychiatric medications as prescribed at discharge - instructions are provided to you in the discharge paperwork  -Follow-up with outpatient primary care doctor and other specialists -for management of preventative medicine and chronic medical disease.  -Testing: Follow-up with outpatient  provider for lab results:  01-08-2023:  TSH 1.624 BMP: 139/3.6/105/22/11/0.70/97 Lithium level: 0.40  -Recommend abstinence from alcohol, tobacco, and other illicit drug use at discharge.   -If your psychiatric symptoms recur, worsen, or if you have side effects to your psychiatric medications, call your outpatient psychiatric provider, 911, 988 or go to the nearest emergency department.  -If suicidal thoughts recur, call your outpatient psychiatric provider, 911, 988 or go to the nearest emergency department.   Signed: Christoper Allegra, MD 01/18/2023, 10:32 AM   Total Time Spent in Direct Patient Care:  I personally spent 35 minutes on the unit in direct patient care. The direct patient care time included face-to-face time with the patient, reviewing  the patient's chart, communicating with other professionals, and coordinating care. Greater than 50% of this time was spent in counseling or coordinating care with the patient regarding goals of hospitalization, psycho-education, and discharge planning needs.   Janine Limbo, MD Psychiatrist

## 2023-01-18 NOTE — Progress Notes (Signed)
Recreation Therapy Notes  INPATIENT RECREATION TR PLAN  Patient Details Name: Rebecca Hoover MRN: [id number] DOB: 2001/01/18 Today's Date: 01/18/2023  Rec Therapy Plan Is patient appropriate for Therapeutic Recreation?: Yes Treatment times per week: about 3 days Estimated Length of Stay: 5-7 days TR Treatment/Interventions: Group participation (Comment)  Discharge Criteria Pt will be discharged from therapy if:: Discharged Treatment plan/goals/alternatives discussed and agreed upon by:: Patient/family  Discharge Summary Short term goals set: See patient care plan Short term goals met: Adequate for discharge Progress toward goals comments: Groups attended Which groups?: Self-esteem, Wellness, Coping skills, Goal setting, Other (Comment) (Problem Solving) Reason goals not met: Pt needed more than two prompts during groups. Therapeutic equipment acquired: N/A Reason patient discharged from therapy: Discharge from hospital Pt/family agrees with progress & goals achieved: Yes Date patient discharged from therapy: 01/18/23   Giann Obara-McCall, LRT,CTRS [place] A Jaice Lague-McCall 01/18/2023, 12:19 PM

## 2023-01-18 NOTE — Discharge Instructions (Addendum)
-  Follow-up with your outpatient psychiatric provider -instructions on appointment date, time, and address (location) are provided to you in discharge paperwork.  -Take your psychiatric medications as prescribed at discharge - instructions are provided to you in the discharge paperwork  -Labs: TSH 1.624 BMP: 139/3.6/105/22/11/0.70/97 Lithium level: 0.40  -Follow-up with outpatient primary care doctor and other specialists -for management of preventative medicine and any chronic medical disease.  -Recommend abstinence from alcohol, tobacco, and other illicit drug use at discharge.   -If your psychiatric symptoms recur, worsen, or if you have side effects to your psychiatric medications, call your outpatient psychiatric provider, 911, [phone] or go to the nearest emergency department.  -If suicidal thoughts occur, call your outpatient psychiatric provider, 911, [phone] or go to the nearest emergency department.

## 2023-01-18 NOTE — Group Note (Signed)
Recreation Therapy Group Note   Group Topic:Problem Solving  Group Date: 01/18/2023 Start Time: 1005 End Time: 1020 Facilitators: Jaiden Wahab-McCall, LRT,CTRS Location: [address] Dayroom   Goal Area(s) Addresses:  Patient will define components of whole wellness. Patient will verbalize benefit of whole wellness.  Group Description: Mental Gymnastics. LRT and patients discussed the components of wellness (mental, physical and spiritual). LRT and patients also discussed the importance of wellness and how it affects [place] on a daily basis. LRT then gave patients two worksheets of brain teasers. LRT explained to patients instead of doing physical exercise, they were going to exercise their brains and solve the brain teasers presented on the worksheets. Patients were given 20 minutes to decode as many of brain teasers they could before they went over them as a group     Affect/Mood: N/A   Participation Level: Did not attend    Clinical Observations/Individualized Feedback:      Plan: Continue to engage patient in RT group sessions 2-3x/week.   Jovonni Borquez-McCall, LRT,CTRS 01/18/2023 12:06 PM

## 2023-01-18 NOTE — Progress Notes (Signed)
[place] Group Notes:  (Nursing/MHT/Case Management/Adjunct)  Date:  01/18/2023  Time:  2000  Type of Therapy:   wrap up group  Participation Level:  Active  Participation Quality:  Attentive and Sharing  Affect:  Anxious and Labile  Cognitive:  Disorganized  Insight:  Improving  Engagement in Group:  Developing/Improving  Modes of Intervention:  Clarification, Education, and Support  Summary of Progress/Problems: Positive thinking and positive change were discussed.   Winfield Rast S 01/18/2023, 12:05 AM

## 2023-01-18 NOTE — Plan of Care (Signed)
Patient was able to focus on group topics with a little more than two prompts.  Patient was able to engage once she was given redirection.    Alanis Clift-McCall, LRT,CTRS

## 2023-01-18 NOTE — Progress Notes (Signed)
Pt discharged to lobby. Pt was stable and appreciative at that time. All papers and prescriptions were given and valuables returned. Verbal understanding expressed. Denies SI/HI and A/VH. Pt given opportunity to express concerns and ask questions.  

## 2024-03-02 ENCOUNTER — Encounter: Payer: Self-pay | Admitting: Behavioral Health

## 2024-03-02 ENCOUNTER — Ambulatory Visit: Payer: 59 | Admitting: Behavioral Health

## 2024-03-02 VITALS — BP 104/66 | HR 80 | Ht 67.0 in | Wt 250.0 lb

## 2024-03-02 DIAGNOSIS — F319 Bipolar disorder, unspecified: Secondary | ICD-10-CM

## 2024-03-02 DIAGNOSIS — F3110 Bipolar disorder, current episode manic without psychotic features, unspecified: Secondary | ICD-10-CM | POA: Diagnosis not present

## 2024-03-02 MED ORDER — AMANTADINE HCL 100 MG PO CAPS: 100.0000 mg | ORAL_CAPSULE | Freq: Two times a day (BID) | ORAL | 3 refills | Status: DC

## 2024-03-02 MED ORDER — ARIPIPRAZOLE 10 MG PO TABS: 10.0000 mg | ORAL_TABLET | Freq: Every day | ORAL | 1 refills | Status: DC

## 2024-03-02 NOTE — Progress Notes (Signed)
 [facility] MD/PA/NP Initial Note  03/02/2024 5:04 PM Delina Kruczek  MRN:  [id number]  Chief Complaint:  Chief Complaint   Manic Behavior; Medication Refill; Establish Care; Patient Education; Stress; Anxiety     HPI:  "Rebecca Hoover", 23 year old female presents to this office for initial visit and to establish care.  She is accompanied by her mother Victorino Dike with her verbal consent.  Collateral information should be considered reliable.  Referred to some chart notes that were available.  Mother of patient states that she will bring in patient's records for review.  Patient is calm and pleasant.  She is very well-groomed and neat.  Articulate with slight speech impediment.  She says that in December 2022 she experienced a loss of her father and then went through a break-up with her boyfriend shortly after.  In January 2024 she experienced a very acute psychotic episode.  Says that she sought care at various facilities or was transferred multiple times.  Says that she received care at [facility], [facility] [place] [facility] [place], and [facility] [employer].  Says that after multiple hospitalizations and numerous medication trials her condition worsened.  She became catatonic and unresponsive.  Says that she was not eating and experienced severe decline ending up in ICU.  States that she was diagnosed with bipolar disorder with psychosis.  Says that she could not remember but one facility discussed diagnosing her with schizophrenia but it was not official.  Says that she slowly started to recover and reduced most of her medications.  She currently is only taking 2 medications at this time; Abilify and amantadine.  States that for the last 8 months she has been enjoying good stability.  She reports 0/10  depression, and 3/10 anxiety.  Her PHQ 2 was negative.  Her MDQ had 9 of the 14 criterion marked yes but stated that these were not current symptoms.  Says that she is here today for a  psychiatric provider to manage her medications, but she does not want to make any new changes today.  States that she just needs refills on her current meds.  No mania in the last 8 months.  Does endorse history of auditory and visual hallucinations during previous episode.  Denies  current auditory or visual hallucinations or delirium.  Denies any history of illicit substance use or experimentation.  Denies SI or HI.  She currently has strong family support from her mother and is living at home in [place].  She is receiving life coaching and currently searching for part-time job.  She was unable to return to college at [employer].  Patient states that she feels safe and verbally contracts for safety with this Clinical research associate.  Mother is assisting with her medication administration and other care.  She follows up with PCP and OB/GYN.  Past psychiatric medication trials:  Abilify Cogentin Lithium Seroquel Hydroxyzine Lorazepam Trazodone Namenda Modafinil Zyprexa Depakote Lexapro Wellbutrin Latuda Haldol Valium Ativan Klonopin Ambien Restoril Risperdal Visit Diagnosis:    ICD-10-CM   1. Bipolar I disorder (HCC)  F31.9 amantadine (SYMMETREL) 100 MG capsule    2. Bipolar I disorder, most recent episode (or current) manic (HCC)  F31.10 ARIPiprazole (ABILIFY) 10 MG tablet      Past Psychiatric History: Bipolar Disorder with psychosis most recent mania, Catatonia, ECT therapy, Parkinsonism  Past Medical History: History reviewed. No pertinent past medical history. History reviewed. No pertinent surgical history.  Family Psychiatric History: see chart     Family History:  Family History  Problem Relation Age of Onset   Bipolar disorder Sister    Bipolar disorder Paternal Grandfather     Social History:  Social History   Socioeconomic History   Marital status: Single    Spouse name: Not on file   Number of children: Not on file   Years of education: 13   Highest  education level: Some college, no degree  Occupational History   Occupation: Looking for part time job  Tobacco Use   Smoking status: Unknown   Smokeless tobacco: Not on file  Substance and Sexual Activity   Alcohol use: Never   Drug use: Never   Sexual activity: Not Currently  Other Topics Concern   Not on file  Social History Narrative   Lives in [place]. Enjoys coloring  in free time. Has two dogs.    Social Drivers of Health   Financial Resource Strain: Patient Unable To Answer (03/06/2023)   Received from [facility]   Overall Financial Resource Strain (CARDIA)    Difficulty of Paying Living Expenses: Patient unable to answer  Food Insecurity: No Food Insecurity (05/01/2023)   Received from [facility]   Hunger Vital Sign    Worried About Running Out of Food in the Last Year: Never true    Ran Out of Food in the Last Year: Never true  Transportation Needs: No Transportation Needs (05/02/2023)   Received from [facility] - Transportation    Lack of Transportation (Medical): No    Lack of Transportation (Non-Medical): No  Physical Activity: Not on file  Stress: No Stress Concern Present (05/01/2023)   Received from [facility] - Occupational Stress Questionnaire    Feeling of Stress : Only a little  Social Connections: Unknown (02/19/2023)   Received from [facility]   Social Network    Social Network: Not on file    Allergies: No Known Allergies  Metabolic Disorder Labs: Lab Results  Component Value Date   HGBA1C 4.8 01/02/2023   MPG 91.06 01/02/2023   MPG 88.19 12/29/2022   No results found for: "PROLACTIN" Lab Results  Component Value Date   CHOL 128 01/02/2023   TRIG 34 01/02/2023   HDL 54 01/02/2023   CHOLHDL 2.4 01/02/2023   VLDL 7 01/02/2023   LDLCALC 67 01/02/2023   LDLCALC 88 12/29/2022   Lab Results  Component Value Date   TSH 1.624 01/08/2023    TSH 0.680 12/29/2022    Therapeutic Level Labs: Lab Results  Component Value Date   LITHIUM 0.40 (L) 01/08/2023   No results found for: "VALPROATE" No results found for: "CBMZ"  Current Medications: Current Outpatient Medications  Medication Sig Dispense Refill   amantadine (SYMMETREL) 100 MG capsule Take 1 capsule (100 mg total) by mouth 2 (two) times daily. 60 capsule 3   Amantadine HCl 100 MG tablet Take 100 mg by mouth 2 (two) times daily.     ARIPiprazole (ABILIFY) 10 MG tablet Take 10 mg by mouth daily.     ARIPiprazole (ABILIFY) 10 MG tablet Take 1 tablet (10 mg total) by mouth daily. 90 tablet 1   benztropine (COGENTIN) 0.5 MG tablet Take 1 tablet (0.5 mg total) by mouth daily. 30 tablet 0   hydrOXYzine (ATARAX) 25 MG tablet Take 1 tablet (25 mg total) by mouth 3 (three) times daily as needed for anxiety. 30 tablet 0   lithium carbonate (LITHOBID) 300 MG  ER tablet Take 1 tablet (300 mg total) by mouth every 12 (twelve) hours. 60 tablet 0   norethindrone-ethinyl estradiol (LOESTRIN) 1-20 MG-MCG tablet Take 1 tablet by mouth daily. (Patient not taking: Reported on 12/30/2022)     risperiDONE (RISPERDAL) 0.5 MG tablet Take 1 tablet (0.5 mg total) by mouth in the morning. 30 tablet 0   risperiDONE (RISPERDAL) 1 MG tablet Take 2.5 tablets (2.5 mg total) by mouth at bedtime. 75 tablet 0   temazepam (RESTORIL) 15 MG capsule Take 1 capsule (15 mg total) by mouth at bedtime as needed for sleep. 30 capsule 0   traZODone (DESYREL) 50 MG tablet Take 1 tablet (50 mg total) by mouth at bedtime as needed for sleep. 30 tablet 0   No current facility-administered medications for this visit.    Medication Side Effects: none  Orders placed this visit:  No orders of the defined types were placed in this encounter.   Psychiatric Specialty Exam:  Review of Systems  HENT:  Positive for hearing loss.   Gastrointestinal:  Positive for constipation.  Allergic/Immunologic: Negative.     Blood  pressure 104/66, pulse 80, height 5\' 7"  (1.702 m), weight 250 lb (113.4 kg).Body mass index is 39.16 kg/m.  General Appearance: Casual, Neat, and Well Groomed  Eye Contact:  Good  Speech:  Clear and Coherent and Pressured, Impediment  Volume:  Normal  Mood:  NA  Affect:  Appropriate  Thought Process:  Coherent  Orientation:  Full (Time, Place, and Person)  Thought Content: Logical   Suicidal Thoughts:  No  Homicidal Thoughts:  No  Memory:  WNL  Judgement:  Good  Insight:  Good  Psychomotor Activity:  Normal  Concentration:  Concentration: Good  Recall:  Good  Fund of Knowledge: Good  Language: Good  Assets:  Desire for Improvement  ADL's:  Intact  Cognition: WNL  Prognosis:  Good   Screenings:  PHQ2-9    Flowsheet Row Office Visit from 03/02/2024 in [facility]  PHQ-2 Total Score 0      Flowsheet Row ED from 12/31/2022 in [facility] Emergency Department at [facility] ED from 01/20/2022 in [facility] Emergency Department at [facility]  C-SSRS RISK CATEGORY No Risk No Risk       Receiving Psychotherapy: No   Treatment Plan/Recommendations:   Greater than 50% of  60 min face to face time with patient was spent on counseling and coordination of care. We discussed her acute bout of psychosis in early 2024 last several months.   We discussed her multiple hospitalizations and care at various facilities in the area. Pt experienced catatonia and was non verbal. Her health declined due to poor nutrition and unresponsiveness.  Reports a dx of Schizophrenia was discussed but no official diagnosis. We reviewed her past medications and current plan of care.  We talked about her diagnosis of bipolar disorder with psychosis.  Discussed her ECT treatment and current good stability over the previous 8 months.  Reviewed her medications and discussed her goals for care at this office.  Patient's condition last year was extremely complex and  involved several months of serious illness and care in ICU.  Although patient has been stable previous 8 months, she is high risk for relapse.  She is calm, pleasant, and smiling today.  She has slight speech impediment.  We agreed to:  Will continue Abilify 10 mg daily Will continue Amantadine 100 mg twice daily To follow-up in 6  weeks to reassess Will notify this office for worsening symptoms Provided emergency contact information Discussed potential metabolic side effects associated with atypical antipsychotics, as well as potential risk for movement side effects. Advised pt to contact office if movement side effects occur.   Reviewed PDMP  Joan Flores, NP

## 2024-04-14 ENCOUNTER — Ambulatory Visit: Admitting: Behavioral Health

## 2024-04-14 ENCOUNTER — Encounter: Payer: Self-pay | Admitting: Behavioral Health

## 2024-04-14 DIAGNOSIS — F3110 Bipolar disorder, current episode manic without psychotic features, unspecified: Secondary | ICD-10-CM

## 2024-04-14 DIAGNOSIS — F5105 Insomnia due to other mental disorder: Secondary | ICD-10-CM | POA: Diagnosis not present

## 2024-04-14 DIAGNOSIS — F99 Mental disorder, not otherwise specified: Secondary | ICD-10-CM | POA: Diagnosis not present

## 2024-04-14 MED ORDER — TRAZODONE HCL 50 MG PO TABS: 50.0000 mg | ORAL_TABLET | Freq: Every evening | ORAL | 1 refills | Status: DC | PRN

## 2024-04-14 MED ORDER — AMANTADINE HCL 100 MG PO TABS: 100.0000 mg | ORAL_TABLET | Freq: Two times a day (BID) | ORAL | 1 refills | Status: DC

## 2024-04-14 MED ORDER — ARIPIPRAZOLE 10 MG PO TABS: 10.0000 mg | ORAL_TABLET | Freq: Every day | ORAL | 1 refills | Status: DC

## 2024-04-14 NOTE — Progress Notes (Signed)
 [facility] Med Check  Patient ID: Rebecca Hoover,  MRN: [id number]  PCP: Verdia Glad, NP  Date of Evaluation: 04/14/2024 Time spent:30 minutes  Chief Complaint:  Chief Complaint   Depression; Anxiety; Follow-up; Medication Refill; Patient Education     HISTORY/CURRENT STATUS: HPI  "Rebecca Hoover", 23 year old female presents to this office for follow up and medication management.  Collateral information should be considered reliable.  Patient is calm and pleasant.  She is very well-groomed and neat.  Articulate with slight speech impediment.  She is smiling today and says she is doing very well. She is looking for employment.   She currently is only taking 3 medications at this time; Abilify , trazodone , amantadine .  Still enjoying good stability without any questions or concerns today.  She reports 0/10  depression, and 3/10 anxiety.  States that she just needs refills on her current meds.  No mania in the last 9 months.   Denies  current auditory or visual hallucinations or delirium at this time. Denies any history of illicit substance use or experimentation.  Denies SI or HI.  She currently has strong family support from her mother and is living at home in [place] .  She is receiving life coaching and currently searching for part-time job.  She was unable to return to college at [employer].  Patient states that she feels safe and verbally contracts for safety with this Clinical research associate.  Mother is assisting with her medication administration and other care.  She follows up with PCP and OB/GYN.   Past psychiatric medication trials:   Abilify  Cogentin  Lithium  Seroquel Hydroxyzine  Lorazepam  Trazodone  Namenda Modafinil Zyprexa  Depakote Lexapro  Wellbutrin Latuda Haldol  Valium Ativan  Klonopin Ambien Restoril  Risperdal    Individual Medical History/ Review of Systems: Changes? :No   Allergies: Patient has no known allergies.  Current Medications:  Current Outpatient  Medications:    amantadine  (SYMMETREL ) 100 MG capsule, Take 1 capsule (100 mg total) by mouth 2 (two) times daily., Disp: 60 capsule, Rfl: 3   Amantadine  HCl 100 MG tablet, Take 1 tablet (100 mg total) by mouth 2 (two) times daily., Disp: 180 tablet, Rfl: 1   ARIPiprazole  (ABILIFY ) 10 MG tablet, Take 10 mg by mouth daily., Disp: , Rfl:    ARIPiprazole  (ABILIFY ) 10 MG tablet, Take 1 tablet (10 mg total) by mouth daily., Disp: 90 tablet, Rfl: 1   benztropine  (COGENTIN ) 0.5 MG tablet, Take 1 tablet (0.5 mg total) by mouth daily., Disp: 30 tablet, Rfl: 0   lithium  carbonate (LITHOBID ) 300 MG ER tablet, Take 1 tablet (300 mg total) by mouth every 12 (twelve) hours., Disp: 60 tablet, Rfl: 0   norethindrone -ethinyl estradiol (LOESTRIN) 1-20 MG-MCG tablet, Take 1 tablet by mouth daily. (Patient not taking: Reported on 12/30/2022), Disp: , Rfl:    risperiDONE  (RISPERDAL ) 0.5 MG tablet, Take 1 tablet (0.5 mg total) by mouth in the morning., Disp: 30 tablet, Rfl: 0   risperiDONE  (RISPERDAL ) 1 MG tablet, Take 2.5 tablets (2.5 mg total) by mouth at bedtime., Disp: 75 tablet, Rfl: 0   temazepam  (RESTORIL ) 15 MG capsule, Take 1 capsule (15 mg total) by mouth at bedtime as needed for sleep., Disp: 30 capsule, Rfl: 0   traZODone  (DESYREL ) 50 MG tablet, Take 1 tablet (50 mg total) by mouth at bedtime as needed for sleep., Disp: 90 tablet, Rfl: 1 Medication Side Effects: none  Family Medical/ Social History: Changes? No  MENTAL HEALTH EXAM:  There were no vitals taken for this visit.There is no height or weight  on file to calculate BMI.  General Appearance: Casual, Neat, and Well Groomed  Eye Contact:  Good  Speech:  Clear and Coherent  Volume:  Decreased  Mood:  NA  Affect:  Appropriate  Thought Process:  Coherent  Orientation:  Full (Time, Place, and Person)  Thought Content: Logical   Suicidal Thoughts:  No  Homicidal Thoughts:  No  Memory:  WNL  Judgement:  Good  Insight:  Good  Psychomotor  Activity:  Normal  Concentration:  Concentration: Good  Recall:  Good  Fund of Knowledge: Good  Language: Good  Assets:  Desire for Improvement  ADL's:  Intact  Cognition: WNL  Prognosis:  Good    DIAGNOSES:    ICD-10-CM   1. Insomnia due to other mental disorder  F51.05 traZODone  (DESYREL ) 50 MG tablet   F99     2. Bipolar I disorder, most recent episode (or current) manic (HCC)  F31.10 ARIPiprazole  (ABILIFY ) 10 MG tablet    Amantadine  HCl 100 MG tablet      Receiving Psychotherapy: No    RECOMMENDATIONS:   Greater than 50% of  30  min face to face time with patient was spent on counseling and coordination of care. Discussed her report of continued good stability. She is making apparent progress. Speech has improved. She smiles regularly and appropriately during interview.  Reviewed and discussed her hx of ECT treatments. Still suspect of underlying schizophrenia dx. Reviewed her medications and discussed her goals for care at this office.  Patient's condition last year was extremely complex and involved several months of serious illness and care in ICU.  Although patient has been stable previous 8 months, she is high risk for relapse.  Very calm, and pleasant today. She has slight speech impediment.   We agreed to:   Will continue Abilify  10 mg daily Will continue Amantadine  100 mg twice daily To follow-up in 6 weeks to reassess Will notify this office for worsening symptoms Provided emergency contact information Discussed potential metabolic side effects associated with atypical antipsychotics, as well as potential risk for movement side effects. Advised pt to contact office if movement side effects occur.   Reviewed PDMP   Lincoln Renshaw, NP

## 2024-06-10 ENCOUNTER — Ambulatory Visit: Admitting: Behavioral Health

## 2024-06-10 ENCOUNTER — Encounter: Payer: Self-pay | Admitting: Behavioral Health

## 2024-06-10 DIAGNOSIS — F99 Mental disorder, not otherwise specified: Secondary | ICD-10-CM

## 2024-06-10 DIAGNOSIS — F5105 Insomnia due to other mental disorder: Secondary | ICD-10-CM

## 2024-06-10 DIAGNOSIS — F3110 Bipolar disorder, current episode manic without psychotic features, unspecified: Secondary | ICD-10-CM | POA: Diagnosis not present

## 2024-06-10 MED ORDER — AMANTADINE HCL 100 MG PO TABS: 100.0000 mg | ORAL_TABLET | Freq: Two times a day (BID) | ORAL | 1 refills | Status: DC

## 2024-06-10 MED ORDER — ARIPIPRAZOLE 10 MG PO TABS: 10.0000 mg | ORAL_TABLET | Freq: Every day | ORAL | 1 refills | Status: DC

## 2024-06-10 MED ORDER — TRAZODONE HCL 50 MG PO TABS: 50.0000 mg | ORAL_TABLET | Freq: Every evening | ORAL | 1 refills | Status: DC | PRN

## 2024-06-10 NOTE — Progress Notes (Signed)
 [facility] Med Check  Patient ID: Rebecca Hoover,  MRN: [id number]  PCP: Zachary Lamar BRAVO, NP  Date of Evaluation: 06/10/2024 Time spent:30 minutes  Chief Complaint:  Chief Complaint   Depression; Anxiety; Follow-up; Medication Refill; Patient Education     HISTORY/CURRENT STATUS: HPI Rebecca Hoover, 23 year old female presents to this office for follow up and medication management.  Collateral information should be considered reliable.  Patient is calm and pleasant.  She is very well-groomed and neat.  Articulate with slight speech impediment.  She is smiling today and says she is doing very well. Says she has no questions or concerns today.  She is looking for employment.   Has a job Psychologist, occupational. She reports 0/10  depression, and 3/10 anxiety.  States that she just needs refills on her current meds.  No mania in the the last year.    Denies  current auditory or visual hallucinations or delirium at this time. Denies any history of illicit substance use or experimentation.  Denies SI or HI.  She currently has strong family support from her mother and is living at home in [place] .She is  looking forward to a beach trip soon for vacation.   Patient states that she feels safe and verbally contracts for safety with this Clinical research associate.  Mother is assisting with her medication administration and other care.  She follows up with PCP and OB/GYN.   Past psychiatric medication trials:   Abilify  Cogentin  Lithium  Seroquel Hydroxyzine  Lorazepam  Trazodone  Namenda Modafinil Zyprexa  Depakote Lexapro  Wellbutrin Latuda Haldol  Valium Ativan  Klonopin Ambien Restoril  Risperdal  Individual Medical History/ Review of Systems: Changes? :No   Allergies: Patient has no known allergies.  Current Medications:  Current Outpatient Medications:    amantadine  (SYMMETREL ) 100 MG capsule, Take 1 capsule (100 mg total) by mouth 2 (two) times daily., Disp: 60 capsule, Rfl: 3   Amantadine  HCl 100 MG  tablet, Take 1 tablet (100 mg total) by mouth 2 (two) times daily., Disp: 180 tablet, Rfl: 1   ARIPiprazole  (ABILIFY ) 10 MG tablet, Take 10 mg by mouth daily., Disp: , Rfl:    ARIPiprazole  (ABILIFY ) 10 MG tablet, Take 1 tablet (10 mg total) by mouth daily., Disp: 90 tablet, Rfl: 1   lithium  carbonate (LITHOBID ) 300 MG ER tablet, Take 1 tablet (300 mg total) by mouth every 12 (twelve) hours., Disp: 60 tablet, Rfl: 0   norethindrone -ethinyl estradiol (LOESTRIN) 1-20 MG-MCG tablet, Take 1 tablet by mouth daily. (Patient not taking: Reported on 12/30/2022), Disp: , Rfl:    traZODone  (DESYREL ) 50 MG tablet, Take 1 tablet (50 mg total) by mouth at bedtime as needed for sleep., Disp: 90 tablet, Rfl: 1 Medication Side Effects: none  Family Medical/ Social History: Changes? No  MENTAL HEALTH EXAM:  Height 5' 7 (1.702 m), weight 203 lb (92.1 kg).Body mass index is 31.79 kg/m.  General Appearance: Casual, Neat, and Well Groomed  Eye Contact:  Good  Speech:  Clear and Coherent  Volume:  Normal  Mood:  NA  Affect:  Appropriate  Thought Process:  Coherent  Orientation:  Full (Time, Place, and Person)  Thought Content: Logical   Suicidal Thoughts:  No  Homicidal Thoughts:  No  Memory:  WNL  Judgement:  Good  Insight:  Good  Psychomotor Activity:  Normal  Concentration:  Concentration: Good  Recall:  Good  Fund of Knowledge: Good  Language: Good  Assets:  Desire for Improvement  ADL's:  Intact  Cognition: WNL  Prognosis:  Good    DIAGNOSES:  ICD-10-CM   1. Insomnia due to other mental disorder  F51.05 traZODone  (DESYREL ) 50 MG tablet   F99     2. Bipolar I disorder, most recent episode (or current) manic (HCC)  F31.10 Amantadine  HCl 100 MG tablet    ARIPiprazole  (ABILIFY ) 10 MG tablet      Receiving Psychotherapy: No    RECOMMENDATIONS:   Greater than 50% of  30  min face to face time with patient was spent on counseling and coordination of care. Discussed her report of  continued good stability. Still making good progress. Speech has improved even more since last visit.  She smiles regularly and appropriately during interview.  Very calm, and pleasant today.    We agreed to:   Will continue Abilify  10 mg daily Will continue Amantadine  100 mg twice daily To follow-up in 6 weeks to reassess Will notify this office for worsening symptoms Provided emergency contact information Discussed potential metabolic side effects associated with atypical antipsychotics, as well as potential risk for movement side effects. Advised pt to contact office if movement side effects occur.   Reviewed PDMP  Redell DELENA Pizza, NP

## 2024-08-28 ENCOUNTER — Telehealth: Payer: Self-pay | Admitting: Behavioral Health

## 2024-08-31 ENCOUNTER — Encounter: Payer: Self-pay | Admitting: Behavioral Health

## 2024-08-31 ENCOUNTER — Ambulatory Visit (INDEPENDENT_AMBULATORY_CARE_PROVIDER_SITE_OTHER): Admitting: Behavioral Health

## 2024-08-31 DIAGNOSIS — F5105 Insomnia due to other mental disorder: Secondary | ICD-10-CM

## 2024-08-31 DIAGNOSIS — F3110 Bipolar disorder, current episode manic without psychotic features, unspecified: Secondary | ICD-10-CM

## 2024-08-31 DIAGNOSIS — F99 Mental disorder, not otherwise specified: Secondary | ICD-10-CM

## 2024-08-31 MED ORDER — AMANTADINE HCL 100 MG PO TABS: 100.0000 mg | ORAL_TABLET | Freq: Two times a day (BID) | ORAL | 1 refills | Status: AC

## 2024-08-31 MED ORDER — TRAZODONE HCL 50 MG PO TABS: 50.0000 mg | ORAL_TABLET | Freq: Every evening | ORAL | 1 refills | Status: DC | PRN

## 2024-08-31 MED ORDER — ARIPIPRAZOLE 10 MG PO TABS: 10.0000 mg | ORAL_TABLET | Freq: Every day | ORAL | 1 refills | Status: DC

## 2024-08-31 NOTE — Progress Notes (Signed)
 [facility]  Patient ID: Rebecca Hoover,  MRN: [id number]  PCP: Zachary Lamar BRAVO, NP  Date of Evaluation: 08/31/2024 Time spent:30 minutes  Chief Complaint:  Chief Complaint   Anxiety; Depression; Follow-up; Medication Refill; Patient Education     HISTORY/CURRENT STATUS: HPI Rebecca Hoover, 23 year old female presents to this office for follow up and medication management.  Collateral information should be considered reliable.  Patient is calm and pleasant.  She is very well-groomed and neat.  Articulate with slight speech impediment.  She is smiling today and says she is doing very well. Says she has no questions or concerns today. 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  She reports 0/10  depression, and 3/10 anxiety.  States that she just needs refills on her current meds.  No mania in the the last year.    Denies  current auditory or visual hallucinations or delirium at this time. Denies any history of illicit substance use or experimentation.  Denies SI or HI.  She currently has strong family support from her mother and is living at home in [place] .  Patient states that she feels safe and verbally contracts for safety with this Clinical research associate.  Mother is assisting with her medication administration and other care.  She follows up with PCP and OB/GYN.   Past psychiatric medication trials:   Abilify  Cogentin  Lithium  Seroquel Hydroxyzine  Lorazepam  Trazodone  Namenda Modafinil Zyprexa  Depakote Lexapro  Wellbutrin Latuda Haldol  Valium Ativan  Klonopin Ambien Restoril  Risperdal        Individual Medical History/ Review of Systems: Changes? :No   Allergies: Patient has no known allergies.  Current Medications:  Current Outpatient Medications:    amantadine  (SYMMETREL ) 100 MG capsule, Take 1 capsule (100 mg total) by mouth 2 (two) times daily., Disp: 60 capsule, Rfl: 3   Amantadine  HCl 100 MG  tablet, Take 1 tablet (100 mg total) by mouth 2 (two) times daily., Disp: 180 tablet, Rfl: 1   ARIPiprazole  (ABILIFY ) 10 MG tablet, Take 10 mg by mouth daily., Disp: , Rfl:    ARIPiprazole  (ABILIFY ) 10 MG tablet, Take 1 tablet (10 mg total) by mouth daily., Disp: 90 tablet, Rfl: 1   lithium  carbonate (LITHOBID ) 300 MG ER tablet, Take 1 tablet (300 mg total) by mouth every 12 (twelve) hours., Disp: 60 tablet, Rfl: 0   norethindrone -ethinyl estradiol (LOESTRIN) 1-20 MG-MCG tablet, Take 1 tablet by mouth daily. (Patient not taking: Reported on 12/30/2022), Disp: , Rfl:    traZODone  (DESYREL ) 50 MG tablet, Take 1 tablet (50 mg total) by mouth at bedtime as needed for sleep., Disp: 90 tablet, Rfl: 1 Medication Side Effects: none  Family Medical/ Social History: Changes? No  MENTAL HEALTH EXAM:  There were no vitals taken for this visit.There is no height or weight on file to calculate BMI.  General Appearance: Casual, Neat, and Well Groomed  Eye Contact:  Good  Speech:  Clear and Coherent  Volume:  Normal  Mood:  NA  Affect:  Appropriate  Thought Process:  Coherent  Orientation:  Full (Time, Place, and Person)  Thought Content: Logical   Suicidal Thoughts:  No  Homicidal Thoughts:  No  Memory:  WNL  Judgement:  Good  Insight:  Good  Psychomotor Activity:  Normal  Concentration:  Concentration: Good  Recall:  Good  Fund of Knowledge: Good  Language: Good  Assets:  Desire for Improvement  ADL's:  Intact  Cognition: WNL  Prognosis:  Good    DIAGNOSES:    ICD-10-CM   1. Insomnia due to other mental disorder  F51.05 traZODone  (DESYREL ) 50 MG tablet   F99     2. Bipolar I disorder, most recent episode (or current) manic (HCC)  F31.10 Amantadine  HCl 100 MG tablet    ARIPiprazole  (ABILIFY ) 10 MG tablet      Receiving Psychotherapy: No    RECOMMENDATIONS:  Greater than 50% of  30  min face to face time with patient was spent on counseling and coordination of care. Discussed her  report of continued good stability. Still making good progress. Speech has improved even more since last visit.  She smiles regularly and appropriately during interview.  Very calm, and pleasant today.    We agreed to:   Will continue Abilify  10 mg daily Will continue Amantadine  100 mg twice daily To follow-up in 3 months to reassess Will notify this office for worsening symptoms Provided emergency contact information Discussed potential metabolic side effects associated with atypical antipsychotics, as well as potential risk for movement side effects. Advised pt to contact office if movement side effects occur.   Reviewed PDMP      Redell DELENA Pizza, NP

## 2024-09-10 ENCOUNTER — Ambulatory Visit: Admitting: Behavioral Health

## 2024-10-09 ENCOUNTER — Other Ambulatory Visit: Payer: Self-pay | Admitting: Behavioral Health

## 2024-11-30 ENCOUNTER — Encounter: Payer: Self-pay | Admitting: Behavioral Health

## 2024-11-30 ENCOUNTER — Ambulatory Visit: Admitting: Behavioral Health

## 2024-11-30 DIAGNOSIS — F319 Bipolar disorder, unspecified: Secondary | ICD-10-CM

## 2024-11-30 DIAGNOSIS — F3110 Bipolar disorder, current episode manic without psychotic features, unspecified: Secondary | ICD-10-CM

## 2024-11-30 MED ORDER — ARIPIPRAZOLE 10 MG PO TABS: 10.0000 mg | ORAL_TABLET | Freq: Every day | ORAL | 1 refills | Status: AC

## 2024-11-30 MED ORDER — AMANTADINE HCL 100 MG PO CAPS: 100.0000 mg | ORAL_CAPSULE | Freq: Two times a day (BID) | ORAL | 1 refills | Status: AC

## 2024-11-30 NOTE — Progress Notes (Signed)
 [facility]  Patient ID: Emanuelle Hammerstrom,  MRN: [id number]  PCP: Zachary Lamar BRAVO, NP  Date of Evaluation: 11/30/2024 Time spent:30 minutes  Chief Complaint:  Chief Complaint   Depression; Anxiety; Follow-up; Medication Refill; Patient Education     HISTORY/CURRENT STATUS: HPI Nastashia, 23 year old female presents to this office for follow up and medication management.  Collateral information should be considered reliable.  Patient is calm and pleasant.  She is very well-groomed and neat.  Articulate with slight speech impediment.  She is smiling today and says she continues to do very well.  She has a new boyfriend of 3 month. Says they have met each others parents. She has some concerns over sweaty hands only when she drives. However denies any increased anxiety or panic while in the car. 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  She reports 0/10  depression, and 3/10 anxiety.  States that she just needs refills on her current meds.  No mania in the the last year.    Denies  current auditory or visual hallucinations or delirium at this time. Denies any history of illicit substance use or experimentation.  Denies SI or HI.  She currently has strong family support from her mother and is living at home in [place] .  Patient states that she feels safe and verbally contracts for safety with this clinical research associate.  Mother is assisting with her medication administration and other care.  She follows up with PCP and OB/GYN.   Past psychiatric medication trials:   Abilify  Cogentin  Lithium  Seroquel Hydroxyzine  Lorazepam  Trazodone  Namenda Modafinil Zyprexa  Depakote Lexapro  Wellbutrin Latuda Haldol  Valium Ativan  Klonopin Ambien Restoril  Risperdal     Individual Medical History/ Review of Systems: Changes? :No   Allergies: Patient has no known allergies.  Current Medications: Current Medications[1] Medication  Side Effects: none  Family Medical/ Social History: Changes? No  MENTAL HEALTH EXAM:  Height 5' 7 (1.702 m), weight 198 lb (89.8 kg), last menstrual period 10/21/2024.Body mass index is 31.01 kg/m.  General Appearance: Casual and Neat  Eye Contact:  Good  Speech:  Clear and Coherent  Volume:  Normal  Mood:  NA  Affect:  Appropriate  Thought Process:  Coherent  Orientation:  Full (Time, Place, and Person)  Thought Content: Logical   Suicidal Thoughts:  No  Homicidal Thoughts:  No  Memory:  WNL  Judgement:  Good  Insight:  Good  Psychomotor Activity:  Normal  Concentration:  Concentration: Good  Recall:  Good  Fund of Knowledge: Good  Language: Good  Assets:  Desire for Improvement  ADL's:  Intact  Cognition: WNL  Prognosis:  Good    DIAGNOSES:    ICD-10-CM   1. Bipolar I disorder, most recent episode (or current) manic (HCC)  F31.10 ARIPiprazole  (ABILIFY ) 10 MG tablet    2. Bipolar I disorder (HCC)  F31.9 amantadine  (SYMMETREL ) 100 MG capsule      Receiving Psychotherapy: No    RECOMMENDATIONS:   Greater than 50% of  30  min face to face time with patient was spent on counseling and coordination of care. Discussed her report of continued good stability. Still making good progress.  She smiles regularly and appropriately during interview.  Very calm, and pleasant today.  I do not think palm sweating is due to medications at this point only occurring in car. Says that she will wait and see if symptoms improve. Discussed BC with her today  and explained risk of pregnancy while on AP. She expressed understanding but denies being sexually active at this time.    We agreed to:   Will continue Abilify  10 mg daily Will continue Amantadine  100 mg twice daily To follow-up in 3 months to reassess Will notify this office for worsening symptoms Provided emergency contact information Discussed potential metabolic side effects associated with atypical antipsychotics, as well as  potential risk for movement side effects. Advised pt to contact office if movement side effects occur.   Reviewed PDMP           Redell DELENA Pizza, NP     [1]  Current Outpatient Medications:    amantadine  (SYMMETREL ) 100 MG capsule, Take 1 capsule (100 mg total) by mouth 2 (two) times daily., Disp: 180 capsule, Rfl: 1   Amantadine  HCl 100 MG tablet, Take 1 tablet (100 mg total) by mouth 2 (two) times daily., Disp: 180 tablet, Rfl: 1   ARIPiprazole  (ABILIFY ) 10 MG tablet, Take 10 mg by mouth daily., Disp: , Rfl:    ARIPiprazole  (ABILIFY ) 10 MG tablet, TAKE 1 TABLET BY MOUTH ONCE DAILY, Disp: 90 tablet, Rfl: 0   ARIPiprazole  (ABILIFY ) 10 MG tablet, Take 1 tablet (10 mg total) by mouth daily., Disp: 90 tablet, Rfl: 1   lithium  carbonate (LITHOBID ) 300 MG ER tablet, Take 1 tablet (300 mg total) by mouth every 12 (twelve) hours., Disp: 60 tablet, Rfl: 0   norethindrone -ethinyl estradiol (LOESTRIN) 1-20 MG-MCG tablet, Take 1 tablet by mouth daily. (Patient not taking: Reported on 12/30/2022), Disp: , Rfl:

## 2025-03-01 ENCOUNTER — Ambulatory Visit: Admitting: Behavioral Health
